# Patient Record
Sex: Male | Born: 1935 | Race: White | Hispanic: No | Marital: Married | Smoking: Never smoker
Health system: Southern US, Community
[De-identification: ages and names within clinical notes are randomized; demographics above are authoritative.]

## PROBLEM LIST (undated history)

## (undated) DIAGNOSIS — K449 Diaphragmatic hernia without obstruction or gangrene: Secondary | ICD-10-CM

## (undated) DIAGNOSIS — I509 Heart failure, unspecified: Secondary | ICD-10-CM

## (undated) DIAGNOSIS — F32A Depression, unspecified: Secondary | ICD-10-CM

## (undated) DIAGNOSIS — M1990 Unspecified osteoarthritis, unspecified site: Secondary | ICD-10-CM

## (undated) DIAGNOSIS — R0600 Dyspnea, unspecified: Secondary | ICD-10-CM

## (undated) DIAGNOSIS — I10 Essential (primary) hypertension: Secondary | ICD-10-CM

## (undated) DIAGNOSIS — K621 Rectal polyp: Secondary | ICD-10-CM

## (undated) DIAGNOSIS — F419 Anxiety disorder, unspecified: Secondary | ICD-10-CM

## (undated) DIAGNOSIS — K219 Gastro-esophageal reflux disease without esophagitis: Secondary | ICD-10-CM

## (undated) DIAGNOSIS — F329 Major depressive disorder, single episode, unspecified: Secondary | ICD-10-CM

## (undated) DIAGNOSIS — K5730 Diverticulosis of large intestine without perforation or abscess without bleeding: Secondary | ICD-10-CM

## (undated) DIAGNOSIS — C61 Malignant neoplasm of prostate: Secondary | ICD-10-CM

## (undated) DIAGNOSIS — D649 Anemia, unspecified: Secondary | ICD-10-CM

## (undated) DIAGNOSIS — R011 Cardiac murmur, unspecified: Secondary | ICD-10-CM

## (undated) DIAGNOSIS — I2510 Atherosclerotic heart disease of native coronary artery without angina pectoris: Secondary | ICD-10-CM

## (undated) DIAGNOSIS — E119 Type 2 diabetes mellitus without complications: Secondary | ICD-10-CM

## (undated) DIAGNOSIS — K2270 Barrett's esophagus without dysplasia: Secondary | ICD-10-CM

## (undated) DIAGNOSIS — N200 Calculus of kidney: Secondary | ICD-10-CM

## (undated) DIAGNOSIS — I359 Nonrheumatic aortic valve disorder, unspecified: Secondary | ICD-10-CM

## (undated) DIAGNOSIS — J189 Pneumonia, unspecified organism: Secondary | ICD-10-CM

## (undated) DIAGNOSIS — R42 Dizziness and giddiness: Secondary | ICD-10-CM

## (undated) DIAGNOSIS — I219 Acute myocardial infarction, unspecified: Secondary | ICD-10-CM

## (undated) HISTORY — DX: Cardiac murmur, unspecified: R01.1

## (undated) HISTORY — DX: Nonrheumatic aortic valve disorder, unspecified: I35.9

## (undated) HISTORY — DX: Rectal polyp: K62.1

## (undated) HISTORY — DX: Anxiety disorder, unspecified: F41.9

## (undated) HISTORY — DX: Type 2 diabetes mellitus without complications: E11.9

## (undated) HISTORY — DX: Anemia, unspecified: D64.9

## (undated) HISTORY — DX: Malignant neoplasm of prostate: C61

## (undated) HISTORY — DX: Diaphragmatic hernia without obstruction or gangrene: K44.9

## (undated) HISTORY — DX: Essential (primary) hypertension: I10

## (undated) HISTORY — PX: CATARACT EXTRACTION: SUR2

## (undated) HISTORY — DX: Barrett's esophagus without dysplasia: K22.70

## (undated) HISTORY — PX: INGUINAL HERNIA REPAIR: SUR1180

## (undated) HISTORY — DX: Major depressive disorder, single episode, unspecified: F32.9

## (undated) HISTORY — DX: Depression, unspecified: F32.A

## (undated) HISTORY — DX: Dizziness and giddiness: R42

## (undated) HISTORY — DX: Calculus of kidney: N20.0

## (undated) HISTORY — PX: OTHER SURGICAL HISTORY: SHX169

## (undated) HISTORY — DX: Diverticulosis of large intestine without perforation or abscess without bleeding: K57.30

## (undated) HISTORY — PX: TRANSURETHRAL RESECTION OF PROSTATE: SHX73

---

## 2000-07-24 ENCOUNTER — Ambulatory Visit (HOSPITAL_COMMUNITY): Admission: RE | Admit: 2000-07-24 | Discharge: 2000-07-24 | Payer: Self-pay | Admitting: Ophthalmology

## 2004-04-04 ENCOUNTER — Encounter (INDEPENDENT_AMBULATORY_CARE_PROVIDER_SITE_OTHER): Payer: Self-pay | Admitting: *Deleted

## 2004-04-04 ENCOUNTER — Observation Stay (HOSPITAL_COMMUNITY): Admission: RE | Admit: 2004-04-04 | Discharge: 2004-04-05 | Payer: Self-pay | Admitting: Urology

## 2004-06-13 ENCOUNTER — Ambulatory Visit (HOSPITAL_COMMUNITY): Admission: RE | Admit: 2004-06-13 | Discharge: 2004-06-13 | Payer: Self-pay | Admitting: Urology

## 2004-08-11 ENCOUNTER — Ambulatory Visit: Payer: Self-pay | Admitting: Family Medicine

## 2004-11-16 ENCOUNTER — Ambulatory Visit: Payer: Self-pay | Admitting: Family Medicine

## 2004-12-07 ENCOUNTER — Ambulatory Visit: Payer: Self-pay | Admitting: Family Medicine

## 2005-01-11 ENCOUNTER — Ambulatory Visit: Payer: Self-pay | Admitting: Family Medicine

## 2005-02-27 ENCOUNTER — Ambulatory Visit: Payer: Self-pay | Admitting: Family Medicine

## 2005-06-12 ENCOUNTER — Ambulatory Visit: Payer: Self-pay | Admitting: Family Medicine

## 2005-08-02 ENCOUNTER — Ambulatory Visit: Payer: Self-pay | Admitting: Family Medicine

## 2005-10-05 ENCOUNTER — Ambulatory Visit: Payer: Self-pay | Admitting: Family Medicine

## 2006-01-11 ENCOUNTER — Ambulatory Visit: Payer: Self-pay | Admitting: Family Medicine

## 2006-02-20 ENCOUNTER — Ambulatory Visit: Payer: Self-pay | Admitting: Family Medicine

## 2006-02-21 ENCOUNTER — Ambulatory Visit: Payer: Self-pay | Admitting: Family Medicine

## 2006-03-05 ENCOUNTER — Ambulatory Visit (HOSPITAL_COMMUNITY): Admission: RE | Admit: 2006-03-05 | Discharge: 2006-03-05 | Payer: Self-pay | Admitting: Urology

## 2006-04-18 ENCOUNTER — Ambulatory Visit: Payer: Self-pay | Admitting: Family Medicine

## 2006-07-27 ENCOUNTER — Ambulatory Visit: Payer: Self-pay | Admitting: Family Medicine

## 2006-08-23 ENCOUNTER — Ambulatory Visit: Payer: Self-pay | Admitting: Family Medicine

## 2006-08-24 ENCOUNTER — Ambulatory Visit: Payer: Self-pay | Admitting: Cardiology

## 2006-08-29 ENCOUNTER — Ambulatory Visit: Payer: Self-pay | Admitting: Family Medicine

## 2006-09-07 ENCOUNTER — Encounter: Payer: Self-pay | Admitting: Cardiology

## 2006-09-13 ENCOUNTER — Ambulatory Visit: Payer: Self-pay | Admitting: Cardiology

## 2007-04-01 ENCOUNTER — Encounter: Admission: RE | Admit: 2007-04-01 | Discharge: 2007-04-01 | Payer: Self-pay | Admitting: Otolaryngology

## 2008-01-20 ENCOUNTER — Ambulatory Visit: Payer: Self-pay | Admitting: Cardiology

## 2008-01-20 ENCOUNTER — Encounter: Payer: Self-pay | Admitting: Physician Assistant

## 2008-04-01 ENCOUNTER — Encounter: Payer: Self-pay | Admitting: Cardiology

## 2009-10-27 DIAGNOSIS — I359 Nonrheumatic aortic valve disorder, unspecified: Secondary | ICD-10-CM | POA: Insufficient documentation

## 2009-10-27 DIAGNOSIS — R0989 Other specified symptoms and signs involving the circulatory and respiratory systems: Secondary | ICD-10-CM

## 2009-10-27 DIAGNOSIS — R0609 Other forms of dyspnea: Secondary | ICD-10-CM

## 2009-10-27 DIAGNOSIS — I119 Hypertensive heart disease without heart failure: Secondary | ICD-10-CM | POA: Insufficient documentation

## 2009-10-28 ENCOUNTER — Ambulatory Visit: Payer: Self-pay | Admitting: Cardiology

## 2009-11-04 ENCOUNTER — Ambulatory Visit: Payer: Self-pay | Admitting: Cardiology

## 2009-11-22 ENCOUNTER — Emergency Department (HOSPITAL_COMMUNITY): Admission: EM | Admit: 2009-11-22 | Discharge: 2009-11-22 | Payer: Self-pay | Admitting: Emergency Medicine

## 2009-11-29 ENCOUNTER — Ambulatory Visit: Payer: Self-pay | Admitting: Cardiology

## 2009-11-29 DIAGNOSIS — Z87448 Personal history of other diseases of urinary system: Secondary | ICD-10-CM

## 2009-12-14 ENCOUNTER — Telehealth (INDEPENDENT_AMBULATORY_CARE_PROVIDER_SITE_OTHER): Payer: Self-pay | Admitting: *Deleted

## 2010-01-06 ENCOUNTER — Encounter: Payer: Self-pay | Admitting: Cardiology

## 2010-01-19 ENCOUNTER — Encounter: Payer: Self-pay | Admitting: Cardiology

## 2010-02-07 ENCOUNTER — Encounter: Payer: Self-pay | Admitting: Cardiology

## 2010-02-24 ENCOUNTER — Encounter (INDEPENDENT_AMBULATORY_CARE_PROVIDER_SITE_OTHER): Payer: Self-pay | Admitting: *Deleted

## 2010-09-25 DIAGNOSIS — K449 Diaphragmatic hernia without obstruction or gangrene: Secondary | ICD-10-CM

## 2010-09-25 DIAGNOSIS — K2270 Barrett's esophagus without dysplasia: Secondary | ICD-10-CM

## 2010-09-25 HISTORY — DX: Diaphragmatic hernia without obstruction or gangrene: K44.9

## 2010-09-25 HISTORY — DX: Barrett's esophagus without dysplasia: K22.70

## 2010-10-27 NOTE — Miscellaneous (Signed)
Summary: meds update  Clinical Lists Changes  Medications: Changed medication from CLONIDINE HCL 0.1 MG TABS (CLONIDINE HCL) Take 1 tablet by mouth every evening to CLONIDINE HCL 0.1 MG TABS (CLONIDINE HCL) Take 1 tablet by mouth as needed if bp > 170

## 2010-10-27 NOTE — Assessment & Plan Note (Signed)
Summary: EST - LAST SEEN 2009   Visit Type:  Follow-up Primary Provider:  Nyland  CC:  follow-up visit.  History of Present Illness:   The patient is a 77 year old male with history of hypertension, diabetes mellitus and aortic sclerosis.  The patient reports no subdural chest pain.  He has no orthopnea PND palpitation or syncope is doing remarkably well.  His blood pressure however significant elevated in the office.  He states however that at home sometimes his blood pressure can be low with systolic lowered on 110 mm of mercury.  This is associated with dizziness and weakness.  The patient denies however any presyncope or syncope.   Preventive Screening-Counseling & Management  Alcohol-Tobacco     Smoking Status: never  Current Medications (verified): 1)  Diltiazem Hcl 30 Mg Tabs (Diltiazem Hcl) .... Take 1 Tablet By Mouth Two Times A Day 2)  Aspir-Low 81 Mg Tbec (Aspirin) .... Take 1 Tablet By Mouth Once A Day 3)  Hyzaar 50-12.5 Mg Tabs (Losartan Potassium-Hctz) .... Take 1 Tablet By Mouth Once A Day 4)  Glipizide 5 Mg Tabs (Glipizide) .... Take 1 Tablet By Mouth Once A Day 5)  Arthritis Pain Regimen Ex St 500 Mg Tbec (Aspirin) .... Take 1 Tablet By Mouth Two Times A Day 6)  Fish Oil 1200 Mg Caps (Omega-3 Fatty Acids) .... Take 1 Tablet By Mouth Four Times A Day 7)  Diovan Hct 160-25 Mg Tabs (Valsartan-Hydrochlorothiazide) .... Take 1/2 Tablet By Mouth Every Morning 8)  Prilosec Otc 20 Mg Tbec (Omeprazole Magnesium) .... Take 1 Tablet By Mouth Once A Day  Allergies (verified): No Known Drug Allergies  Comments:  Nurse/Medical Assistant: The patient's medications and allergies were reviewed with the patient and were updated in the Medication and Allergy Lists. List reviewed.  Past History:  Past Medical History: Last updated: 10/27/2009  Hypertension.      Significantly improved with recent adjustment of medication       regimen.  Type 2 diabetes  mellitus. Dizziness Presyncope Cardiac murmur Aortic Valve disorder Respiratory Abnormal  Past Surgical History: Last updated: 10/27/2009 Cataract Extraction Repair of urethral sphincter as well as replacement  of reservoir and pump.  right inguinal hernia.  Family History: Last updated: 10/27/2009 Notable for CAD, CVA and cancer  Social History: Last updated: 10/27/2009 Retired  Alcohol Use - no Drug Use - no  Risk Factors: Smoking Status: never (10/28/2009)  Social History: Smoking Status:  never  Review of Systems  The patient denies fatigue, malaise, fever, weight gain/loss, vision loss, decreased hearing, hoarseness, chest pain, palpitations, shortness of breath, prolonged cough, wheezing, sleep apnea, coughing up blood, abdominal pain, blood in stool, nausea, vomiting, diarrhea, heartburn, incontinence, blood in urine, muscle weakness, joint pain, leg swelling, rash, skin lesions, headache, fainting, dizziness, depression, anxiety, enlarged lymph nodes, easy bruising or bleeding, and environmental allergies.    Vital Signs:  Patient profile:   77 year old male Height:      67 inches Weight:      173 pounds Pulse rate:   82 / minute BP sitting:   183 / 79  (left arm) Cuff size:   regular  Vitals Entered By: Carlye Grippe (October 28, 2009 9:27 AM)  Serial Vital Signs/Assessments:  Time      Position  BP       Pulse  Resp  Temp     By 10:18 AM            177/87  81                    Hoover Brunette, LPN  CC: follow-up visit   Physical Exam  Additional Exam:  General: Well-developed, well-nourished in no distress head: Normocephalic and atraumatic eyes PERRLA/EOMI intact, conjunctiva and lids normal nose: No deformity or lesions mouth normal dentition, normal posterior pharynx neck: Supple, no JVD.  No masses, thyromegaly or abnormal cervical nodes.  No carotid bruits lungs: Normal breath sounds bilaterally without wheezing.  Normal percussion heart:  regular rate and rhythm with normal S1 and S2, no S3 or S4.  PMI is normal.  No pathological murmurs.  There is however a 2/6 murmur at the right upper sternal border without radiation to the carotids abdomen: Normal bowel sounds, abdomen is soft and nontender without masses, organomegaly or hernias noted.  No hepatosplenomegaly musculoskeletal: Back normal, normal gait muscle strength and tone normal pulsus: Pulse is normal in all 4 extremities Extremities: No peripheral pitting edema neurologic: Alert and oriented x 3 skin: Intact without lesions or rashes cervical nodes: No significant adenopathy psychologic: Normal affect    EKG  Procedure date:  10/28/2009  Findings:      normal sinus rhythm.  Left axis deviation.  Heart rate 76 beats/min  Impression & Recommendations:  Problem # 1:  BEN HTN HEART DISEASE WITHOUT HEART FAIL (ICD-402.10) I asked the patient to change his Diovan/HCTZ to a.m. dosing.it appears the patient may have afternoon hypotension due to multiple drug effect at the same time.  If he remains hypertensive for increase of Diovan/HCTZ may be indicated, possibly in divided doses His updated medication list for this problem includes:    Diltiazem Hcl 30 Mg Tabs (Diltiazem hcl) .Marland Kitchen... Take 1 tablet by mouth two times a day    Aspir-low 81 Mg Tbec (Aspirin) .Marland Kitchen... Take 1 tablet by mouth once a day    Hyzaar 50-12.5 Mg Tabs (Losartan potassium-hctz) .Marland Kitchen... Take 1 tablet by mouth once a day    Arthritis Pain Regimen Ex St 500 Mg Tbec (Aspirin) .Marland Kitchen... Take 1 tablet by mouth two times a day    Diovan Hct 160-25 Mg Tabs (Valsartan-hydrochlorothiazide) .Marland Kitchen... Take 1/2 tablet by mouth every morning  Problem # 2:  OTHER DYSPNEA AND RESPIRATORY ABNORMALITIES (ICD-786.09) patient reports no recurrent dyspnea. His updated medication list for this problem includes:    Diltiazem Hcl 30 Mg Tabs (Diltiazem hcl) .Marland Kitchen... Take 1 tablet by mouth two times a day    Aspir-low 81 Mg Tbec  (Aspirin) .Marland Kitchen... Take 1 tablet by mouth once a day    Hyzaar 50-12.5 Mg Tabs (Losartan potassium-hctz) .Marland Kitchen... Take 1 tablet by mouth once a day    Arthritis Pain Regimen Ex St 500 Mg Tbec (Aspirin) .Marland Kitchen... Take 1 tablet by mouth two times a day    Diovan Hct 160-25 Mg Tabs (Valsartan-hydrochlorothiazide) .Marland Kitchen... Take 1/2 tablet by mouth every morning  Problem # 3:  AORTIC VALVE DISORDERS (ICD-424.1) patient has a soft murmur in exam.  He has previously been evaluated with an echocardiogram.  The murmur does not sound pathologic and I do not think a repeat echocardiogram is currently needed.  Of note is that the patient does not take Hyzaar anymore His updated medication list for this problem includes:    Hyzaar 50-12.5 Mg Tabs (Losartan potassium-hctz) .Marland Kitchen... Take 1 tablet by mouth once a day    Diovan Hct 160-25 Mg Tabs (Valsartan-hydrochlorothiazide) .Marland Kitchen... Take 1/2 tablet by mouth every morning  Other  Orders: EKG w/ Interpretation (93000)  Patient Instructions: 1)  Change Diovan/HCTZ to a.m. dosing 2)  Follow up in  1 month 3)  Nurse visit 1 week to recheck blood pressure

## 2010-10-27 NOTE — Assessment & Plan Note (Signed)
Summary: BP CHECK  Nurse Visit   Vital Signs:  Patient profile:   77 year old male Height:      67 inches Weight:      172.75 pounds Pulse rate:   90 / minute BP sitting:   199 / 86  (left arm) Cuff size:   regular  Vitals Entered By: Cyril Loosen, RN, BSN (November 04, 2009 09:30 AM) Comments Pt here for BP check. He took meds about 0715 this am. Pt brought his home machine for comparison.   Serial Vital Signs/Assessments:  Time      Position  BP       Pulse  Resp  Temp     By 0930                208/101  93                    Cyril Loosen, RN, BSN  Comments: 0930 Pt's BP using his machine (L) arm. By: Cyril Loosen, RN, BSN     Allergies: No Known Drug Allergies Prescriptions: CLONIDINE HCL 0.1 MG TABS (CLONIDINE HCL) Take 1 tablet by mouth once a day  #30 x 6   Entered by:   Hoover Brunette, LPN   Authorized by:   Lewayne Bunting, MD, [facility]   Signed by:   Hoover Brunette, LPN on 11/91/4782   Method used:   Faxed to ...       [facility]ospital [facility] (retail)       [address]       [place], [place]  [address]       Ph: [phone] or [phone]       Fax: [phone]   RxID:   [id number]   Suggest to start Clonidine 0.1mg  Po qdaily Lewayne Bunting, MD, [facility]  November 05, 2009 10:34 AM   Wife notified.  Will send rx to [facility].  Advised to keep bp log and bring to visit in March.  Hoover Brunette, LPN  November 05, 2009 3:06 PM

## 2010-10-27 NOTE — Letter (Signed)
Summary: Engineer, materials at [facility]  [address]   [place], [place] [address]   Phone: [phone]  Fax: [phone]        February 24, 2010 MRN: [id number]   Vincent Savage [address] [place], [place]  [address]   Dear [name],  Your test ordered by Selena Batten has been reviewed by your physician (or physician assistant) and was found to be normal or stable. Your physician (or physician assistant) felt no changes were needed at this time.  ____ Echocardiogram  ____ Cardiac Stress Test  ____ Lab Work  ____ Peripheral vascular study of arms, legs or neck  ____ CT scan or X-ray  ____ Lung or Breathing test  __X__ Other:  blood pressure readings adequate for now per Dr. Andee Lineman    Thank you.   Hoover Brunette, LPN    Duane Boston, M.D., F.A.C.C. Thressa Sheller, M.D., F.A.C.C. Oneal Grout, M.D., F.A.C.C. Cheree Ditto, M.D., F.A.C.C. Daiva Nakayama, M.D., F.A.C.C. Kenney Houseman, M.D., F.A.C.C. Jeanne Ivan, PA-C

## 2010-10-27 NOTE — Letter (Signed)
Summary: Pt's home BP readings  Pt's home BP readings   Imported By: Cyril Loosen, RN, BSN 11/29/2009 14:19:36  _____________________________________________________________________  External Attachment:    Type:   Image     Comment:   External Document

## 2010-10-27 NOTE — Progress Notes (Signed)
Summary: Office Visit/ BLOOD PRESSURE READINGS  Office Visit/ BLOOD PRESSURE READINGS   Imported By: Dorise Hiss 02/15/2010 10:43:23  _____________________________________________________________________  External Attachment:    Type:   Image     Comment:   External Document  Appended Document: Office Visit/ BLOOD PRESSURE READINGS BP readings adquate for now.  Appended Document: Office Visit/ BLOOD PRESSURE READINGS Patient notified by letter.

## 2010-10-27 NOTE — Assessment & Plan Note (Signed)
Summary: 1 MO FU -SRS   Visit Type:  Follow-up Primary Provider:  L. Nyland  CC:  follow-up visit.  History of Present Illness: the patient is a 77 year old male with a history of hypertension, diabetes mellitus and aortic sclerosis.  The patient has had difficulty with control blood pressure there was some confusion regarding the use of diltiazem.  Patient has not been taking his diltiazem.  He reports recent UTI and bronchitis.  He has had some hematuria and he will see Dr. Patsi Sears.  The patient brought in a record of his blood pressure readings.  His blood pressure is under much better control even without diltiazem.  He appears to be responding to clonidine.  The patient denies any chest pain shortness of breath orthopnea or PND.  Preventive Screening-Counseling & Management  Alcohol-Tobacco     Smoking Status: never   Current Medications (verified): 1)  Aspir-Low 81 Mg Tbec (Aspirin) .... Take 1 Tablet By Mouth Once A Day 2)  Glipizide 5 Mg Tabs (Glipizide) .... Take 1 Tablet By Mouth Once A Day 3)  Arthritis Pain Regimen Ex St 500 Mg Tbec (Aspirin) .... Take 1 Tablet By Mouth Two Times A Day 4)  Fish Oil 1200 Mg Caps (Omega-3 Fatty Acids) .... Take 1 Tablet By Mouth Four Times A Day 5)  Diovan Hct 160-25 Mg Tabs (Valsartan-Hydrochlorothiazide) .... Take 1/2 Tablet By Mouth Every Morning 6)  Prilosec Otc 20 Mg Tbec (Omeprazole Magnesium) .... Take 1 Tablet By Mouth Once A Day As Needed 7)  Clonidine Hcl 0.1 Mg Tabs (Clonidine Hcl) .... Take 1 Tablet By Mouth Every Evening  Allergies (verified): 1)  Cipro  Comments:  Nurse/Medical Assistant: The patient's medications were reviewed with the patient and were updated in the Medication List. Pt brought a list of medications to office visit. Pt has not been taking diltiazem because he thought he was suppose to stop this when he started the Clonidine.  Cyril Loosen, RN, BSN (November 29, 2009 2:14 PM)  Past History:  Past  Medical History: Last updated: 10/27/2009  Hypertension.      Significantly improved with recent adjustment of medication       regimen.  Type 2 diabetes mellitus. Dizziness Presyncope Cardiac murmur Aortic Valve disorder Respiratory Abnormal  Past Surgical History: Last updated: 10/27/2009 Cataract Extraction Repair of urethral sphincter as well as replacement  of reservoir and pump.  right inguinal hernia.  Family History: Last updated: 10/27/2009 Notable for CAD, CVA and cancer  Social History: Last updated: 10/27/2009 Retired  Alcohol Use - no Drug Use - no  Risk Factors: Smoking Status: never (11/29/2009)  Review of Systems       The patient complains of blood in urine.  The patient denies fatigue, malaise, fever, weight gain/loss, vision loss, decreased hearing, hoarseness, chest pain, palpitations, shortness of breath, prolonged cough, wheezing, sleep apnea, coughing up blood, abdominal pain, blood in stool, nausea, vomiting, diarrhea, heartburn, incontinence, muscle weakness, joint pain, leg swelling, rash, skin lesions, headache, fainting, dizziness, depression, anxiety, enlarged lymph nodes, easy bruising or bleeding, and environmental allergies.    Vital Signs:  Patient profile:   77 year old male Height:      67 inches Weight:      169.25 pounds Pulse rate:   79 / minute BP sitting:   125 / 72  (left arm) Cuff size:   regular  Vitals Entered By: Cyril Loosen, RN, BSN (November 29, 2009 2:13 PM) CC: follow-up visit  Physical Exam  Additional Exam:  General: Well-developed, well-nourished in no distress head: Normocephalic and atraumatic eyes PERRLA/EOMI intact, conjunctiva and lids normal nose: No deformity or lesions mouth normal dentition, normal posterior pharynx neck: Supple, no JVD.  No masses, thyromegaly or abnormal cervical nodes.  No carotid bruits lungs: Normal breath sounds bilaterally without wheezing.  Normal percussion heart: regular  rate and rhythm with normal S1 and S2, no S3 or S4.  PMI is normal.  No pathological murmurs.  There is however a 2/6 murmur at the right upper sternal border without radiation to the carotids abdomen: Normal bowel sounds, abdomen is soft and nontender without masses, organomegaly or hernias noted.  No hepatosplenomegaly musculoskeletal: Back normal, normal gait muscle strength and tone normal pulsus: Pulse is normal in all 4 extremities Extremities: No peripheral pitting edema neurologic: Alert and oriented x 3 skin: Intact without lesions or rashes cervical nodes: No significant adenopathy psychologic: Normal affect    Impression & Recommendations:  Problem # 1:  BEN HTN HEART DISEASE WITHOUT HEART FAIL (ICD-402.10) blood pressure is under much better control.  We discontinue diltiazem.  The patient will keep track of his blood pressure readings.I also asked him to change his clonidine to p.m. dosing secondary to some associated fatigue with the use of clonidine. The following medications were removed from the medication list:    Diltiazem Hcl 30 Mg Tabs (Diltiazem hcl) .Marland Kitchen... Take 1 tablet by mouth two times a day His updated medication list for this problem includes:    Aspir-low 81 Mg Tbec (Aspirin) .Marland Kitchen... Take 1 tablet by mouth once a day    Arthritis Pain Regimen Ex St 500 Mg Tbec (Aspirin) .Marland Kitchen... Take 1 tablet by mouth two times a day    Diovan Hct 160-25 Mg Tabs (Valsartan-hydrochlorothiazide) .Marland Kitchen... Take 1/2 tablet by mouth every morning    Clonidine Hcl 0.1 Mg Tabs (Clonidine hcl) .Marland Kitchen... Take 1 tablet by mouth every evening  Problem # 2:  AORTIC VALVE DISORDERS (ICD-424.1) the patient has no significant aortic stenosis.  Further follow-up with clinical exam in 6 months. His updated medication list for this problem includes:    Diovan Hct 160-25 Mg Tabs (Valsartan-hydrochlorothiazide) .Marland Kitchen... Take 1/2 tablet by mouth every morning  Problem # 3:  HEMATURIA, HX OF (ICD-V13.09) the  patient will follow up with Dr. Patsi Sears.  Patient Instructions: 1)  Stop Diltiazem 2)  Take Clonidine in the evening.   3)  Follow up in  6 months.

## 2010-10-27 NOTE — Progress Notes (Signed)
Summary: PHONE: FATIGUE/MEDICATION ISSUES  Phone Note Call from Patient Call back at Home Phone [phone]   Caller: Patient Reason for Call: Talk to Nurse, Talk to Doctor Summary of Call: Mr. Vonbargen called stating that he is very fatigue and he feels his BP is dropping. States that he thinks some of his medications is making him feel this way.  Initial call taken by: Zachary George,  December 14, 2009 2:37 PM  Follow-up for Phone Call        States x 2-3 days, been feeling very tired.  Stopped Diltiazem and started Clonidine 0.1mg  daily.  118/64 on 3/21,   103/58 on 3/22, and today 101/57.   Hoover Brunette, LPN  December 14, 2009 4:31 PM  may want to hold clonidine for now and recheck blood pressure Follow-up by: Lewayne Bunting, MD, [facility],  December 14, 2009 5:17 PM  Additional Follow-up for Phone Call Additional follow up Details #1::        Advised pt. on above.  Will call back in 7-10 with update.   Hoover Brunette, LPN  December 16, 2009 3:45 PM

## 2010-10-27 NOTE — Progress Notes (Signed)
Summary: Office Visit/ BLOOD PRESSURE READINGS  Office Visit/ BLOOD PRESSURE READINGS   Imported By: Dorise Hiss 01/11/2010 11:10:54  _____________________________________________________________________  External Attachment:    Type:   Image     Comment:   External Document  Appended Document: Office Visit/ BLOOD PRESSURE READINGS suggest for patient not to take routinely clonidine, and to check blood pressure twice a day and if blood pressure greater than 170 mmHg and a clonidine 0.1 mg p.r.n.  Appended Document: Office Visit/ BLOOD PRESSURE READINGS Patient notified.  Patient verbalized understanding.

## 2010-12-14 LAB — URINE CULTURE: Colony Count: 75000

## 2010-12-14 LAB — CBC
HCT: 34.1 % — ABNORMAL LOW (ref 39.0–52.0)
Hemoglobin: 11.9 g/dL — ABNORMAL LOW (ref 13.0–17.0)
Platelets: 168 10*3/uL (ref 150–400)
RDW: 13.1 % (ref 11.5–15.5)

## 2010-12-14 LAB — POCT I-STAT, CHEM 8
Calcium, Ion: 1.11 mmol/L — ABNORMAL LOW (ref 1.12–1.32)
HCT: 34 % — ABNORMAL LOW (ref 39.0–52.0)
Hemoglobin: 11.6 g/dL — ABNORMAL LOW (ref 13.0–17.0)
Potassium: 4.1 mEq/L (ref 3.5–5.1)
TCO2: 23 mmol/L (ref 0–100)

## 2010-12-14 LAB — DIFFERENTIAL
Basophils Relative: 1 % (ref 0–1)
Eosinophils Absolute: 0.1 10*3/uL (ref 0.0–0.7)
Eosinophils Relative: 1 % (ref 0–5)
Lymphocytes Relative: 5 % — ABNORMAL LOW (ref 12–46)
Monocytes Absolute: 0.9 10*3/uL (ref 0.1–1.0)
Monocytes Relative: 8 % (ref 3–12)
Neutro Abs: 10.3 10*3/uL — ABNORMAL HIGH (ref 1.7–7.7)

## 2010-12-14 LAB — POCT URINALYSIS DIP (DEVICE)
Bilirubin Urine: NEGATIVE
Glucose, UA: NEGATIVE mg/dL
Ketones, ur: NEGATIVE mg/dL

## 2011-02-07 NOTE — Assessment & Plan Note (Signed)
[facility]                          [facility] OFFICE NOTE   NAME:Vincent Savage, Vincent Savage                     MRN:          [id number]  DATE:01/20/2008                            DOB:          January 22, 1918    CARDIOLOGIST:  Learta Codding, M.D., [facility]   PRIMARY CARE PHYSICIAN:  Delaney Meigs, M.D.   REASON FOR VISIT:  30-month followup.   HISTORY OF PRESENT ILLNESS:  Vincent Savage is a delightful 77 year old  male patient with a history of hypertension and diabetes mellitus who  presents to the office today for followup.  He was initially seen by Dr.  Andee Lineman in November of 2007.  At that time, he presented with near-  syncope in the setting of accelerated hypertension.  The patient's  medications were adjusted for blood pressure.  He presented back in  followup in December of 2007 and was doing well.  It was noted that he  had had an echocardiogram that revealed LVH with an EF of 55-60%, aortic  sclerosis with mild trace AI, no significant pulmonary hypertension, and  no definite aortic stenosis.   In the office today, the patient notes he is doing well.  He denies any  chest pain.  Denies any syncope.  He does have occasional episodes of  dizziness that seem to be related to head positioning.  He denies any  true vertiginous symptoms.  He denies any near-syncope or syncope.  He  denies any symptoms similar to what he had when he initially presented  to [place].  He denies orthopnea, PND or pedal edema.  He denies any  palpitations.  He is quite active around his house.  He can ambulate up  and down steps without any significant shortness of breath or chest  pain.   CURRENT MEDICATIONS:  1. Aspirin 81 mg daily.  2. Hyzaar 50/12.5 mg daily.  3. Glipizide 5 mg daily.  4. Arthritis pain reliever b.i.d.  5. Diltiazem 30 mg b.i.d.  6. Fish oil 1200 mg 4 times a day.   PHYSICAL EXAMINATION:  GENERAL:  He is a well-nourished, well-nourished  male.  VITAL SIGNS:  Blood pressure is 163/85, pulse 73, weight 170.4 pounds.  Repeat blood pressure by me by manual cuff is 136/90 on the right,  140/90 on the left.  HEENT:  Normal.  NECK:  Without JVD, lymphadenopathy.  CARDIAC:  Normal S1-S2.  Regular rate and rhythm.  2/6 systolic ejection  murmur best heard at the right upper sternal border.  LUNGS:  Clear to auscultation bilaterally.  ABDOMEN:  Soft, nontender, with normoactive bowel sounds, no  organomegaly.  EXTREMITIES:  Without edema.  NEUROLOGIC:  He is alert and oriented x3.  Cranial nerves II-XII grossly  intact.  VASCULAR:  No carotid bruits noted bilaterally.  No femoral artery  bruits noted bilaterally.   Electrocardiogram reveals sinus rhythm at a rate of 71, leftward axis,  question incomplete right bundle branch block.  No significant change  since previous tracings.   IMPRESSION:  1. Hypertension.  2. Diabetes mellitus.  3. Good left ventricular function.  4. Aortic  sclerosis without aortic stenosis.  5. Dizziness.   PLAN:  Vincent Savage returns to the office day for annual followup.  Overall, he is doing well.  His blood pressure is somewhat borderline.  He has been unable to remember to take his diltiazem 3 times a day.  I  think if he took his diltiazem 3 times a day, he would have optimal  blood pressure.  He will try to adjust how he takes this.  He has annual  followup with Dr. Lysbeth Galas scheduled soon.  No further workup is required  from a cardiovascular standpoint.  I think overall he is doing well, and  we see him back in one year's time in routine followup.      Tereso Newcomer, PA-C  Electronically Signed      Learta Codding, MD,FACC  Electronically Signed   SW/MedQ  DD: 01/20/2008  DT: 01/20/2008  Job #: [id number]   cc:   Delaney Meigs, M.D.

## 2011-02-10 NOTE — Op Note (Signed)
NAME:  Vincent Savage, SLUSHER                        ACCOUNT NO.:  [id number]   MEDICAL RECORD NO.:  [id number]                   PATIENT TYPE:  AMB   LOCATION:  DAY                                  FACILITY:  [facility]   PHYSICIAN:  Sigmund I. Patsi Sears, M.D.         DATE OF BIRTH:  1918-03-31   DATE OF PROCEDURE:  06/13/2004  DATE OF DISCHARGE:                                 OPERATIVE REPORT   PREOPERATIVE DIAGNOSIS:  Malfunction of AMS Urethral Sphincter Prosthesis.   POSTOPERATIVE DIAGNOSIS:  Malfunction of AMS Urethral Sphincter Prosthesis.   PROCEDURES PERFORMED:  Repair of urethral sphincter as well as replacement  of reservoir and pump.   SURGEON:  Dr. Jethro Bolus   RESIDENT SURGEON:  Dr. Thyra Breed   INDICATIONS FOR PROCEDURE:  Vincent Savage is an 77 year old male who had an  artificial urinary sphincter placed in June 1994 following incontinence,  status post radical perineal prostatectomy.  His sphincter was  malfunctioning recently in July 2005 when he underwent replacement of his  sphincter device as well as a right inguinal herniorrhaphy by Dr. Zachery Dakins.  The patient was seen recently in the office to activate his sphincter when  it was found the reservoir to be in the inguinal canal and, in addition, the  pump has migrated near the penile shaft.  Therefore, Vincent Savage is  counseled on having to undergo another surgical procedure for repair of his  AMS sphincter.  Specifically, to find out why the components migrated and  put them in their proper location prior to activating the new sphincter.  All the risks, benefits, and alternatives of the procedure have been  described in detail, and the patient is willing to proceed.   PROCEDURE IN DETAIL:  Following identification by his arm bracelet, the  patient was brought to the operating room and placed in the supine position.  Here, he underwent successful induction of general endotracheal anesthesia  and received  preoperative IV antibiotics.  He was then moved to the dorsal  lithotomy position.  His perineum and genitalia were then shaved and given a  10 minute Betadine prep.  The genitalia, perineum, and lower abdomen were  then scrubbed with Betadine and draped in the usual sterile fashion.  Initial inspection showed the reservoir to be palpable within the left  inguinal region as well as at the base of the penis on the left as well.  The sphincter appeared to be deactivated as it was left from the prior  surgical procedure.  We then placed the ring retractor.  An incision was  made in the midline raphe of the scrotum approximately 4 cm long.  Then the  retractor hooks were placed into the ring to provide optimum exposure.  Using Bovie electrocautery and pick-up, the dartos tissue was dissected down  until we came upon the sphincter pump.  Its capsule was opened, and the pump  was brought out through its capsulotomy.  It did appear to be entangled  within the tubing going to the reservoir.  At this time, with minimal  opening of further capsule, the reservoir full of saline was pulled from the  inguinal region, actually right near the base of the penis.  With these  components in place, we evaluated them for integrity.  There appeared to be  no damage.  However, given the previous placement, the tubing was in fact,  too short to replace the reservoir, and our tubing length was also limited  by the sphincter pump.  However, we elected not to dissect around the  urethra to evaluate the sphincter cuff, as we had no reason to believe it  would be defective.  Therefore, we elected to place a new reservoir and  sphincter pump.  First, we used a long tonsil.   The anterior rectus fascia was then opened in the midline using Bovie  electrocautery, exposing the two bellies of the rectus abdominus muscles.  The muscle was then parted, and a potential space was then created below the  left rectus muscle for  placement of the new sphincter reservoir.  We then  used the long tonsil clamp from the scrotal incision through the  subcutaneous fat pad well to the left lateral aspect and nerve supply to the  penis until the tonsil tips were exposed through the space created below the  left rectus muscle.  A tonsil was then used to follow this through the  scrotal incision.  The tip of the reservoir tubing was then placed within  the tonsil clamp, and this was pulled into the midline incision for its  connection with the reservoir.  At this time, we copiously irrigated the  subrectus pocket with antibiotic solution as well as the reservoir tubing  components.  In a standard fashion, we then applied all connectors to affix  the reservoir to the pump which was located in the scrotal incision.  There  was adequate tubing to make this connection.  Using 0 Vicryl suture, we then  reapproximated the anterior rectus fascia overlying the sphincter reservoir  in a running fashion.  Subcutaneous tissues were then irrigated.  Surgical  clips were used to close the midline incision.   We then turned our attention to the sphincter pump.  A subdartos pouch was  created deep in the dependent portion of the left hemiscrotum.  A Babcock  clamp was then used to hold the pump in this position while the remaining  connection from the pump to the cuff was created in a standard fashion.  The  scrotal incision was then again copiously irrigated with antibiotic  solution.  With all connections in place, we began our closure.  First, to  hold the sphincter pump in its dependent portion of the hemiscrotum, we  placed 2-0 Vicryl interrupted sutures to close the potential space and allow  capsule to form.  We then separated the tubing from the reservoir going to  the sphincter cuff by another layer of dartos closure using running 2-0 Vicryl suture.  There was no tubing exposed at this time.  Therefore, using  2-0 Vicryl, we  closed the dartos layer in a third layer in a running fashion  prior to closure of the skin.  Then 4-0 Vicryl was used to reapproximate the  skin.  Hemostasis was excellent.  All sponge, needle, and instrument counts  were correct x 2.  We then cycled the cuff to ensure its patency.  Once  cycled, we deactivated the cuff.  The scrotal incision was washed, dried,  and collodion applied.  The midline incision was washed, dried, and a  Tegaderm applied.  Kerlix fluff was applied to the scrotum along with a jock  strap.  The patient tolerated the procedure well, and there were no  complications.  Please note that Dr. Jethro Bolus was present and  participated in the entire procedure, as he was the responsible surgeon.   DISPOSITION:  After awaking from general anesthesia, the patient was  transported to the postanesthesia care unit in stable condition.  From here,  he would be observed in the postanesthesia care unit until ready for  discharge to home.  He was sent home with a prescription for 5 days of Cipro  as well as Percocet 10/650 for pain control.  He is to call [phone] for a  return appointment.      EG/MEDQ  D:  06/13/2004  T:  06/14/2004  Job:  [id number]

## 2011-02-10 NOTE — Assessment & Plan Note (Signed)
[facility]                          [facility] OFFICE NOTE   NAME:Vincent Savage, Vincent Savage                     MRN:          [id number]  DATE:09/13/2006                            DOB:          [birth year]    PRIMARY CARDIOLOGIST:  Dr. Lewayne Bunting.   REASON FOR OFFICE VISIT:  Scheduled office followup here.  Please refer  to Dr. Margarita Mail initial consultation note of November 30 for full  details.   Of note, the patient also was referred for an echocardiogram for  assessment of left ventricular function and severity of valvular heart  disease.  Left ventricular function is normal (55-60%) with aortic  sclerosis/mild aortic insufficiency, and no pulmonary hypertension.   The patient reports significant improvement in his symptoms of dizziness  since having his medications adjusted by Dr. Andee Lineman, with up-titration  of Cozaar and the addition of long-acting diltiazem for better blood  pressure control.  He presented with bilateral blood pressure readings  of 180/90 when initially seen in our clinic, and has since had a  significant downward trending of his blood pressure at home with several  readings below 140 systolic.   Given this, the patient also reports that he is generally feeling much  better, and that his episodes of dizziness have decreased significantly.   CURRENT MEDICATIONS:  1. Diltiazem ER 90 daily.  2. Hyzaar 100/25 daily.  3. Aspirin 81 daily.  4. Glipizide ER 2.5 daily.   PHYSICAL EXAM:  Blood pressure 160/80, pulse 60 and regular.  Weight  167.  GENERAL:  An 77 year old male sitting upright in no distress.  NECK:  Palpable carotid pulse without bruits; no JVD.  LUNGS:  Clear to auscultation in all fields.  HEART:  Regular rate and rhythm (S1, S2).  Grade 2/6 short systolic  ejection murmur at the base.  EXTREMITIES:  No pedal edema.  NEURO:  No focal deficit.   IMPRESSION:  1. Hypertension.      a.     Significantly improved  with recent adjustment of medication       regimen.  2. Normal left ventricular function.      a.     By current 2D echocardiogram.  3. Type 2 diabetes mellitus.   PLAN:  In order to reduce cost, the patient will be placed on short-  acting diltiazem at 30 t.i.d. to take advantage of the $4/month program  at [facility] pharmacy.  We will plan on having him return to the clinic in  1 year to followup with Dr. Andee Lineman.      Gene Serpe, PA-C  Electronically Signed      Learta Codding, MD,FACC  Electronically Signed   GS/MedQ  DD: 09/13/2006  DT: 09/13/2006  Job #: [id number]   cc:   Delaney Meigs, M.D.

## 2011-02-10 NOTE — Assessment & Plan Note (Signed)
[facility]                          [facility] OFFICE NOTE   NAME:Vincent Savage                     MRN:          [id number]  DATE:08/24/2006                            DOB:          [birth year]    REASON FOR CONSULTATION:  Evaluation of severe hypertension.   HISTORY OF PRESENT ILLNESS:  Vincent Savage is an 77 year old male with a  history of prostate cancer, but no known cardiovascular disease.  The  patient has a longstanding history of hypertension, and has been on  blood pressure medications with Hyzaar.  Over the last several years his  blood pressure appears to have been relatively controlled, and he  usually checks it on a weekly basis.  More recently the patient was  found to have surges in his blood pressure.  In particular the patient  reported yesterday a dizzy spell when he was trying to set the  thermostat.  He felt that he had an almost near-syncopal episode.  He  checked his blood pressure and it was 222/97.  He went to Dr. Joyce Copa  office, where the blood pressure was confirmed to be particularly  elevated.  It was felt that the patient was in need of a cardiology  consultation for management of his blood pressure, and to rule out  underlying cardiovascular disease.  The patient states that he is  otherwise very active.  He is an NYHA class II.  He does a lot of work  around the house, and last week was cleaning his gutters.  He did not  report any chest pain or shortness of breath.  He also states that he  had some lab work drawn on November 26 by Dr. Lysbeth Galas, and the plan is  apparently to start Zocor.  The remainder of the review of systems is  essentially noncontributory, although the patient does report some  numbness in both lower extremities.   ALLERGIES:  No known drug allergies.   MEDICATIONS:  1. Hyzaar 50/12/5 mg p.o. daily.  2. Glipizide ER 2.5 mg p.o. daily.  3. Arthritis pain reliever.  4. Aspirin 81 mg  p.o. daily.   SOCIAL HISTORY:  The patient lives in [place].  He lives with his wife.  He does not smoke or drink.   FAMILY HISTORY:  Notable for coronary artery disease, CVA and cancer.   REVIEW OF SYSTEMS:  The patient denies any fever or chills.  He does  report some faintness.  He also has significant hearing loss.  He  reports near-syncope, as outlined above, but no sweating or  claudication.  The patient reports a cough which is nonproductive.  He  reports cramps and pains in toes which occurred last evening.  He denies  any rash or pruritus.  He has no weakness or tremors.  Review of systems  is positive for dizziness as outlined above.  He is also very anxious.  The patient denies any melena or hematochezia.  There is no dysuria or  frequency.  There is no flatulence.  The remainder of the review of  systems is negative, and is documented as  above.   PHYSICAL EXAMINATION:  VITAL SIGNS:  Blood pressure 180/90 in the left  arm and 180/90 in the right arm, heart rate is 84 beats per minute.  GENERAL:  Well-nourished white male in no apparent distress.  HEENT:  Pupils anicteric, conjunctivae clear.  NECK:  Supple.  Normal carotid upstroke, no carotid bruits.  Oropharynx  is clear, no thyromegaly.  LUNGS:  Clear bilaterally.  There is no wheezing.  HEART:  Regular rate and rhythm with normal S1, S2.  There is a 2-3/6  crescendo/decrescendo murmur at the right upper sternal border which is  mid to early peaking.  There is no S3, there is no soft second heart  sound either.  The PMI is nondisplaced.  ABDOMEN:  Soft, nontender, no rebound or guarding.  There is no  abdominal bruit.  There is no pulsatile mass.  EXTREMITIES:  Exam reveals no cyanosis, clubbing or edema.  Pulses are  intact bilaterally.  NEUROLOGIC:  The patient is alert, oriented and grossly nonfocal.   EKG:  A 12-lead EKG normal sinus rhythm, detect no acute ischemic  changes and no definite evidence of left  ventricular hypertrophy.   PROBLEM LIST:  1. Accelerated hypertension.  2. Diabetes mellitus.  3. Dizziness and presyncope, possibly related to #1.  4. Cardiac murmur [likely aortic sclerosis].  In addition, the      patient's murmur is located at the right upper sternal border,      suggestive of aortic root diltation.  We will further evaluate this      with an echocardiographic study.   PLAN:  1. I will add diltiazem ER to the patient's medical regimen at 90 mg a      day.  2. I have asked the patient to increase his Hyzaar to 100/25 mg p.o.      daily.  3. The patient will have an echocardiographic study done in 2 weeks to      evaluate the cardiac murmur.  4. I have asked to the patient to have his blood pressure checked on      Tuesday at Dr. Joyce Copa office.  If it is not controlled, he may      need to have diltiazem increased, and possibly have the addition of      clonidine.  The patient also will need in 1 week laboratory work      done, which will need to include a BMET.  5. I will plan to see the patient back in 3 weeks in followup, and      also discuss the echo results at that time.     Learta Codding, MD,FACC  Electronically Signed    GED/MedQ  DD: 08/24/2006  DT: 08/25/2006  Job #: [id number]   cc:   Delaney Meigs, M.D.

## 2011-02-10 NOTE — Op Note (Signed)
NAME:  Vincent Savage, Vincent Savage                        ACCOUNT NO.:  [id number]   MEDICAL RECORD NO.:  [id number]                   PATIENT TYPE:  INP   LOCATION:  0374                                 FACILITY:  [facility]   PHYSICIAN:  Anselm Pancoast. Zachery Dakins, M.D.          DATE OF BIRTH:  1918/03/10   DATE OF PROCEDURE:  04/04/2004  DATE OF DISCHARGE:                                 OPERATIVE REPORT   PREOPERATIVE DIAGNOSES:  1. Large right inguinal hernia.  2. Valve prostate valve rupture.   OPERATION:  Valve remover and replacement (to be dictated by Dr.  Patsi Sears).  My portion is a right inguinal hernia.   FINDINGS:  Large indirect hernia.   HISTORY:  Vincent Savage is an 77 year old male who years ago had a  prostate cancer, and then after a radical prostatectomy had problems with  urinary incontinence; had a valve placed for control approximately nine  years ago.  The patient was also noted to have an inguinal hernia about that  time.  He saw Dr. Maryagnes Amos.  Nothing was advised to do about the hernia.  Recently with the large hernia, the valve is no longer continent and the  patient was referred to me approximately four weeks ago.  In agreement to  repair the hernia simultaneously with Dr. Patsi Sears for removal and  replacing the urethral sphincter prosthesis, Dr. Patsi Sears said he wanted  to be present to remove the nonfunctioning prosthesis on the right at the  time I repaired his hernia.   PREOPERATIVE MEDICATIONS/PREPARATIONS:  Preoperatively he was given 1 g  Kefzol and then prepped sterilely.  After a good Betadine Dr. Patsi Sears  placed a Foley catheter on the operative field.   DESCRIPTION OF PROCEDURE:  Next, the right inguinal incision was made with  sharp dissection down through the skin and subcutaneous Scarpa's fascia.  He  had a very large hernia.  The cord structures were elevated and encompassed  with a Penrose. Then at this time the prosthesis reservoir was up  kind of  medial and superior to the hernia sac.  Dr. Patsi Sears removed the  prosthesis and the tube connectors.   Next, I then separated the hernia sac; this was done on the spinal, but he  also had pretty heavy sedation.  I separated the hernia sac from the cord  structures, and was surprised to see that it was a large indirect hernia.  The sac itself was probably 8 inches and about the size of a stadium cup.  The hernia sac was separated from the surrounding cord structures.  I then  opened the hernia sac.   A high sac ligation was done in direct vision.  The valve was placed back  into the peritoneal cavity (right at the internal ring area) with an 0  Surgilene, and a second suture placed distal to this; then the hernia sac  was removed.   Next, as far as the  floor, where the floor had been basically broken down  because of the large hernia sac, I repaired it with interrupted sutures of 0  Surgilene to recreate the internal ring area.  Next, a piece of Prolene mesh  (like a sail slit laterally) was used to reinforce the floor.  I started at  the symphysis pubis and sutured the inferior limb with a running 2-0  Prolene.  The two tails have been sutured together laterally to reinforce  the internal ring, and then the superior flap was sutured down with  interrupted sutures of 2-0 Prolene to the conjoined tendon and internal  oblique area.  The ilioinguinal nerve, hopefully we protected that.  We then  closed the external oblique.  The cord and testicle on the right, with  removal of the reservoir down at the base of the scrotum, was nearly  mobilized; this was placed back into the scrotum by Dr. Patsi Sears.  Then  the external oblique was closed with a running 3-0 Vicryl, and the testicle  was in its normal pocket.  The Scarpa's fascia was closed with interrupted 3-  0 Vicryl.  The skin was then closed with staples.   Next, Dr. Patsi Sears dissected out the prosthetic sphincter  around the base  of the urethra.  It was at this time that Dr. Brunilda Payor scrubbed in with him and  I was excused.   DISPOSITION:  The patient tolerated the procedure nicely from the hernia  standpoint.  We will keep his Foley catheter for at least 24-48 hr.                                               Anselm Pancoast. Zachery Dakins, M.D.    WJW/MEDQ  D:  04/04/2004  T:  04/05/2004  Job:  [id number]

## 2011-02-10 NOTE — H&P (Signed)
[facility]  Patient:    Vincent Savage, Vincent Savage                     MRN: [id number] Adm. Date:  [id number] Disc. Date: [id number] Attending:  Ivor Messier                         History and Physical  This was a planned outpatient surgical admission of this 77 year old white male admitted for cataract implant surgery of the right eye.  HISTORY OF PRESENT ILLNESS:  This patient has been followed in my office for routine eye care since April 22, 1984.  Examination at that time revealed an acuity of 20/60 right eye, 20/70 left eye without correction and 20/30 right eye, 20/25 left eye with correction.  Presbyopia was noted.  More recently, the patient has gradually developed blurred vision in both eyes.  By August of 1981 he was complaining that his glasses did not work well from the [facility] [place] and that he was told he had cataract formation. Examination revealed a visual acuity with best correction at 20/70 right eye, 20/50 left eye, and nuclear cataract formation was confirmed.  The patient elected to proceed with cataract implant surgery of the right eye now and left eye later in hopes of obtaining a clear, sharper vision.  He signed an informed consent and arrangements were made for his outpatient admission at this time.  PAST MEDICAL HISTORY:  The patient is in stable general health under his regular physician, Dr. Candie Echevaria.  MEDICATIONS: 1. Zantac. 2. Hazaar. 3. One aspirin a day.  He has been told to discontinue the aspirin one week prior to surgery.  REVIEW OF SYSTEMS:  No cardiorespiratory complaints.  PHYSICAL EXAMINATION:  VITAL SIGNS:  As recorded on admission, blood pressure 127/69, temperature 98, pulse 59, and respirations 18.  GENERAL:  The patient is a pleasant, well-developed, well-nourished, 77 year old white male in no acute distress.  HEENT:  Eyes; visual acuity as noted above.  Applanation tonometry 17 mm  each eye.  External ocular and slit-lamp examination; nuclear cataract formation both eyes.  Ocular motility; hypertropia with approximately 12 prism diopters base down right eye.  Fundus examination dilated with Mydriacyl revealed cataract haze, a clear vitreous, attached retina with normal optic nerve, blood vessels, and macula.  CHEST:  Lungs clear to auscultation and percussion.  HEART:  Normal sinus rhythm.  No cardiomegaly and no murmurs.  ABDOMEN:  Negative.  EXTREMITIES:  Negative.  ADMISSION DIAGNOSES: 1. Senile cataract, both eyes. 2. Right hypertropia.  PLAN:  Cataract implant surgery now right eye, left eye later. DD:  07/24/00 TD:  07/24/00 Job: [id number]

## 2011-02-10 NOTE — Op Note (Signed)
[place]  Patient:    Vincent Savage, Vincent Savage                     MRN: [id number] Proc. Date: 07/24/00 Adm. Date:  [id number] Disc. Date: [id number] Attending:  Ivor Messier                           Operative Report  PREOPERATIVE DIAGNOSIS:  Senile nuclear cataract right eye.  POSTOPERATIVE DIAGNOSIS:  Senile nuclear cataract right eye.  PROCEDURE:  Planned extracapsular cataract extraction - phacoemulsification, primary insertion of posterior chamber interocular lens implant.  SURGEON:  Guadelupe Sabin, M.D.  ASSISTANT:  Nurse.  ANESTHESIA:  Local with 4% Xylocaine, 0.75% Marcaine, retrobulbar block, topical Tetracaine, interocular Xylocaine.  Anesthesia standby required.  The patient given sodium Pentothal or Ditropan intravenously during the period of retrobulbar injection.  DESCRIPTION OF PROCEDURE:  After the patient was prepped and draped, a speculum was inserted in the right eye.  The eye was turned downward and a superior rectus traction suture placed.  Schiotz tonometry was recorded at 4 to 5 scale units with a 5.5 gram weight.  A peritomy was performed adjacent to the limbus from the 11 to 1 oclock position.  The corneoscleral junction was cleaned and a corneoscleral groove made with a 45 degree Superblade.  The anterior chamber was then entered with a 2.5 mm diamond Keratome at the 12 oclock position and the 15 degree blade at the 2:30 position.  Using a bent 26 gauge needle and a Helon syringe, a capsulorrhexis was begun and completed with the Graebo forceps.  Hydrodissection and hydrodelineation were performed using 1% Xylocaine.  The 30 degree phacoemulsification tip was then inserted with slow, controlled emulsification of the rather firm lens nucleus.  Total ultrasonic time 1 minute 12 seconds, average power level 11%, total amount of fluid used during the emulsification 55 cc.  Following removal of the nucleus, the  residual cortex was aspirated with the irrigation/aspiration tip.  The posterior capsule appeared intact with a brilliant red fundus reflex.  It was therefore elected to insert an [employer]ergon medical optics [id number] silicon posterior chamber interocular lens implant with UV absorber, Diopter strength +21.00.  This was inserted with the McDonald forceps into the anterior chamber and then centered into the capsular bag using the [facility] lens rotator.  The lens appeared to be well centered.  The Helon which had been used during the procedure was aspirated and replaced with balanced salt solution and Miochol ophthalmic solution.  The operative incision appeared to be self-sealing and no sutures were required.  A light patch and protector shield were applied to the operated right eye along with Maxitrol ointment.  Duration of the procedure and anesthesia administraction 45 minutes.  The patient tolerated the procedure well in general and left the operating room for the recovery room in good condition. DD:  07/24/00 TD:  07/24/00 Job: [id number]

## 2011-02-10 NOTE — Op Note (Signed)
NAME:  Vincent Savage, Vincent Savage                        ACCOUNT NO.:  [id number]   MEDICAL RECORD NO.:  [id number]                   PATIENT TYPE:  INP   LOCATION:  0007                                 FACILITY:  [facility]   PHYSICIAN:  Sigmund I. Patsi Sears, M.D.         DATE OF BIRTH:  Jun 13, 1918   DATE OF PROCEDURE:  04/04/2004  DATE OF DISCHARGE:                                 OPERATIVE REPORT   PREOPERATIVE DIAGNOSES:  Malfunction AMS urethral sphincter prosthesis.   POSTOPERATIVE DIAGNOSES:  Malfunction AMS urethral sphincter prosthesis.   OPERATION:  Replacement of urethral sphincter.   SURGEON:  Sigmund I. Patsi Sears, M.D.   FIRST ASSISTANT:  Lindaann Slough, M.D.   PREPARATION:  After appropriate preanesthesia, the patient is brought to the  operating room, placed on the operating table in dorsal supine position.  He  was replaced in the right lateral decubitus position where spinal anesthetic  was introduced. He was then replaced in the supine position, where the pubis  was prepped with Betadine solution and draped in the usual fashion.   DESCRIPTION OF PROCEDURE:  The patient underwent right inguinal hernia  repair by Dr. Zachery Dakins, and during the repair by Dr. Zachery Dakins, the tubing  for the sphincter reservoir was identified and dissected proximal ward. The  reservoir was identified, with a blow out in the junction at the tubing in  the reservoir. Following this, the reservoir tubing removed.   Following right inguinal hernia repair, the wound is cleansed, and a midline  scrotal incision is made, subcutaneous tissue dissected in the midline. The  button for the previously placed prosthesis identified, and dissected. The  button is removed, and a 4 cm sphincter is placed. A separate left inguinal  incision is made, subcutaneous tissue dissected, and through the sidewall of  the inguinal canal, the reservoir is placed, under the rectus fascia.  Following this, the tubing was  brought into the wound. The sphincter tubing  was brought into the left lower quadrant wound, and the pump was placed in  the scrotum, superficial dartos pouch.  Clamps were placed, and tubing  connections were placed  appropriately. Each wound was then closed separately with interrupted and  running 2 and 3-0 Vicryl suture and skin staples in the left lower quadrant,  and 4-0 Vicryl suture in the skin in the scrotal portion.  The patient  tolerated the procedure well.  He was taken to the recovery room in good  condition.                                               Sigmund I. Patsi Sears, M.D.    SIT/MEDQ  D:  04/04/2004  T:  04/04/2004  Job:  [id number]   cc:   Lindaann Slough, M.D.  [address]  [place]  [place] [address]  Fax: [phone]

## 2011-02-17 ENCOUNTER — Encounter: Payer: Self-pay | Admitting: Gastroenterology

## 2011-02-17 ENCOUNTER — Ambulatory Visit (INDEPENDENT_AMBULATORY_CARE_PROVIDER_SITE_OTHER): Payer: Medicare Other | Admitting: Gastroenterology

## 2011-02-17 ENCOUNTER — Other Ambulatory Visit (INDEPENDENT_AMBULATORY_CARE_PROVIDER_SITE_OTHER): Payer: Medicare Other

## 2011-02-17 ENCOUNTER — Telehealth: Payer: Self-pay | Admitting: *Deleted

## 2011-02-17 DIAGNOSIS — R6889 Other general symptoms and signs: Secondary | ICD-10-CM

## 2011-02-17 DIAGNOSIS — K219 Gastro-esophageal reflux disease without esophagitis: Secondary | ICD-10-CM

## 2011-02-17 DIAGNOSIS — D509 Iron deficiency anemia, unspecified: Secondary | ICD-10-CM

## 2011-02-17 DIAGNOSIS — R1310 Dysphagia, unspecified: Secondary | ICD-10-CM

## 2011-02-17 LAB — IBC PANEL: Iron: 77 ug/dL (ref 42–165)

## 2011-02-17 LAB — FOLATE: Folate: >24.8 ng/mL (ref >5.9)

## 2011-02-17 LAB — VITAMIN B12: Vitamin B-12: 214 pg/mL (ref 211–911)

## 2011-02-17 LAB — FERRITIN: Ferritin: 594.5 ng/mL — ABNORMAL HIGH (ref 22.0–322.0)

## 2011-02-17 MED ORDER — RABEPRAZOLE SODIUM 20 MG PO TBEC: 20.0000 mg | DELAYED_RELEASE_TABLET | Freq: Every day | ORAL | Status: DC

## 2011-02-17 NOTE — Progress Notes (Signed)
History of Present Illness:  This is a 77 year old Caucasian male referred by Dr. Joette Catching for evaluation of progressive solid food dysphagia over the last 2-3 years. He had previous ENT evaluation in 2008 Dr. Jori Moll  that included a barium swallow which showed a hiatal hernia and acid reflux. Not had previous endoscopic exam, but did have a negative colonoscopy in August of 1999. The patient denies lower gastrointestinal complaints except for more frequent stools since being on extended release Prilosec. There is no history of melena, hematochezia, anorexia, weight loss, history of hepatitis, pancreatitis, alcohol, cigarette, oriented use. Review of his multiple labs showed no significant abnormalities. He is on Glucotrol for mild adult onset diabetes. Family history is noncontributory  For Colon cancer or any gastrointestinal malignancies. Patient has had no mental status or neurological problems. He does not take pain medicine except for acetaminophen when necessary for arthritis.  I have reviewed this patient's present history, medical and surgical past history, allergies and medications.     ROS: The remainder of the 10 point ROS is negative.Marland Kitchen specifically no cardiovascular, pulmonary, genitourinary, or general medical problems.  Past Medical History  Diagnosis Date  . Type II or unspecified type diabetes mellitus without mention of complication, not stated as uncontrolled   . Hypertension   . Dysphagia   . Prostate cancer   . Anemia   . Dizziness   . Murmur   . Aortic valve disorder   . Diverticulosis of colon (without mention of hemorrhage)   . Rectal polyp   . Anxiety   . Depression   . Kidney stones    Past Surgical History  Procedure Date  . Cataract extraction   . Inguinal hernia repair   . Transurethral resection of prostate   . Prostetic prostesis     x 2  . Arm surgery     left    reports that he has never smoked. He does not have any smokeless tobacco  history on file. He reports that he does not drink alcohol or use illicit drugs. family history includes Diabetes in his brother; Heart disease in his father; Ovarian cancer in his sister; and Prostate cancer in his brother.  There is no history of Colon cancer. Allergies  Allergen Reactions  . Ciprofloxacin     REACTION: Questions rx d/t feeling bad that relieved after stopping Cipro        Physical Exam: General well developed well nourished patient in no acute distress, appearing his stated age Eyes PERRLA, no icterus, fundoscopic exam per opthamologist Skin no lesions noted Neck supple, no adenopathy, no thyroid enlargement, no tenderness Chest clear to percussion and auscultation Heart no significant murmurs, gallops or rubs noted Abdomen no hepatosplenomegaly masses or tenderness, BS normal.  Extremities no acute joint lesions, edema, phlebitis or evidence of cellulitis. Neurologic patient oriented x 3, cranial nerves intact, no focal neurologic deficits noted. Psychological mental status normal and normal affect.  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 I reviewed his x-ray report and there is no evidence of achalasia. He has mild diarrhea with omeprazole magnesium and I have changed him to AcipHex 20 mg a day. Reflux regime shown to the patient and his wife along with patient education video on reflux management. Endoscopy with esophageal dilatation has been scheduled at his convenience. The risk and benefit of this procedure have been explained in detail and he agrees to proceed. We will hold his diabetic medications the day  of his procedure. I did order serum B12 level for review. No diagnosis found.  Please copy her primary care physician, referring physician, and pertinent subspecialists.

## 2011-02-17 NOTE — Patient Instructions (Signed)
Your procedure has been scheduled for 02/24/2011, please follow the seperate instructions.  Please go to the basement today for your labs.  Stop the Prilosec and start the Aciphex samples once a day.  Today in the office you watched a movie on [place].      Upper GI Endoscopy Upper GI endoscopy means using a flexible scope to look at the esophagus, stomach and upper small bowel. This is done to make a diagnosis in people with heartburn, abdominal pain, or abnormal bleeding. Sometimes an endoscope is needed to remove foreign bodies or food that become stuck in the esophagus; it can also be used to take biopsy samples. For the best results, do not eat or drink for 8 hours before having your upper endoscopy.  To perform the endoscopy, you will probably be sedated and your throat will be numbed with a special spray. The endoscope is then slowly passed down your throat (this will not interfere with your breathing). An endoscopy exam takes 15-30 minutes to complete and there is no real pain. Patients rarely remember much about the procedure. The results of the test may take several days if a biopsy or other test is taken.  You may have a sore throat after an endoscopy exam. Serious complications are very rare. Stick to liquids and soft foods until your pain is better. You should not drive a car or operate any dangerous equipment for at least 24 hours after being sedated. SEEK IMMEDIATE MEDICAL CARE IF:  You have severe throat pain.   You have shortness of breath.   You have bleeding problems.   You have a fever.   You have difficulty recovering from your sedation.  Document Released: 10/19/2004 Document Re-Released: 12/06/2009 [facility] Patient Information 2011 [place], [place].   Acid Reflux (GERD) Acid reflux is also called gastroesophageal reflux disease (GERD). Your stomach makes acid to help digest food. Acid reflux happens when acid from your stomach goes into the tube between your mouth and  stomach (esophagus). Your stomach is protected from the acid, but this tube is not. When acid gets into the tube, it may cause a burning feeling in the chest (heartburn). Besides heartburn, other health problems can happen if the acid keeps going into the tube. Some causes of acid reflux include:  Being overweight.   Smoking.   Drinking alcohol.   Eating large meals.   Eating meals and then going to bed right away.   Eating certain foods.   Increased stomach acid production.  HOME CARE  Take all medicine as told by your doctor.   You may need to:   Lose weight.   Avoid alcohol.   Quit smoking.   Do not eat big meals. It is better to eat smaller meals throughout the day.   Do not eat a meal and then nap or go to bed.   Sleep with your head higher than your stomach.   Avoid foods that bother you.   You may need more tests, or you may need to see a special doctor.  GET HELP RIGHT AWAY IF:  You have chest pain that is different than before.   You have pain that goes to your arms, jaw, or between your shoulder blades.   You throw up (vomit) blood, dark brown liquid, or your throw up looks like coffee grounds.   You have trouble swallowing.   You have trouble breathing or cannot stop coughing.   You feel dizzy or pass out.   Your  skin is cool, wet, and pale.   Your medicine is not helping.  MAKE SURE YOU:   Understand these instructions.   Will watch your condition.   Will get help right away if you are not doing well or get worse.  Document Released: 02/28/2008 Document Re-Released: 12/06/2009 [facility] Patient Information 2011 [place], [place].

## 2011-02-17 NOTE — Telephone Encounter (Signed)
Message copied by Leonette Monarch on Fri Feb 17, 2011  1:04 PM ------      Message from: PATTERSON, DAVID R      Created: Fri Feb 17, 2011 12:08 PM       Stop any therapy, start B12 shots, and he also needs hemochromatosis genetic studies

## 2011-02-17 NOTE — Telephone Encounter (Signed)
Left message for pt to call back, order in for labs he can come on 02/22/2011 before his procedure.

## 2011-02-21 ENCOUNTER — Encounter: Payer: Self-pay | Admitting: Gastroenterology

## 2011-02-21 NOTE — Telephone Encounter (Signed)
Patient will come for labs on 02/22/2011 prior to procedure and he would like Dr Deitra Mayo office to give b12 injections i will forward this note labs and office visit to Dr Lysbeth Galas. Patient will contact Dr Deitra Mayo office to schedule injections.

## 2011-02-22 ENCOUNTER — Ambulatory Visit (AMBULATORY_SURGERY_CENTER): Payer: Medicare Other | Admitting: Gastroenterology

## 2011-02-22 ENCOUNTER — Encounter: Payer: Self-pay | Admitting: Gastroenterology

## 2011-02-22 ENCOUNTER — Other Ambulatory Visit: Payer: Medicare Other

## 2011-02-22 DIAGNOSIS — E538 Deficiency of other specified B group vitamins: Secondary | ICD-10-CM

## 2011-02-22 DIAGNOSIS — K2270 Barrett's esophagus without dysplasia: Secondary | ICD-10-CM

## 2011-02-22 DIAGNOSIS — R1310 Dysphagia, unspecified: Secondary | ICD-10-CM

## 2011-02-22 DIAGNOSIS — K222 Esophageal obstruction: Secondary | ICD-10-CM

## 2011-02-22 DIAGNOSIS — K449 Diaphragmatic hernia without obstruction or gangrene: Secondary | ICD-10-CM

## 2011-02-22 DIAGNOSIS — K219 Gastro-esophageal reflux disease without esophagitis: Secondary | ICD-10-CM

## 2011-02-22 MED ORDER — SODIUM CHLORIDE 0.9 % IV SOLN: 500.0000 mL | INTRAVENOUS | Status: DC

## 2011-02-22 NOTE — Patient Instructions (Signed)
Please read all of the handouts given to you by your recovery room nurse.   You do have a B12 deficiency and you do need B12 shots according to Dr. Jarold Motto.     Please, stay on a soft diet for this evening due to the dilation of your esophagus.   Take an acid reducer medication everyday per Dr. Norval Gable order.   He explained hemochromatosis at length with you and your family.  Please, resume your routine medications.  If you have any questions,  Please call us at [phone]. Thank-you

## 2011-02-23 ENCOUNTER — Telehealth: Payer: Self-pay | Admitting: *Deleted

## 2011-02-23 NOTE — Telephone Encounter (Signed)

## 2011-02-24 ENCOUNTER — Encounter: Payer: Medicare Other | Admitting: Gastroenterology

## 2011-02-25 LAB — HEMOCHROMATOSIS DNA-PCR(C282Y,H63D)

## 2011-02-27 ENCOUNTER — Telehealth: Payer: Self-pay

## 2011-02-27 ENCOUNTER — Other Ambulatory Visit: Payer: Self-pay | Admitting: Gastroenterology

## 2011-02-27 DIAGNOSIS — E538 Deficiency of other specified B group vitamins: Secondary | ICD-10-CM

## 2011-02-27 MED ORDER — CYANOCOBALAMIN 1000 MCG/ML IJ SOLN: INTRAMUSCULAR | Status: AC

## 2011-02-27 NOTE — Progress Notes (Signed)
Called Dr Deitra Mayo office about the b12 injections. I have faxed an order to their office for him to have injections there.

## 2011-02-27 NOTE — Telephone Encounter (Signed)
I spoke with the patient and advised him of the lab results.  He is asking for an order to be sent to Dr Lysbeth Galas for his B12.

## 2011-02-27 NOTE — Telephone Encounter (Signed)
Message copied by Annett Fabian on Mon Feb 27, 2011 10:33 AM ------      Message from: Jarold Motto, DAVID R      Created: Mon Feb 27, 2011  9:26 AM       1 GENE...KIDS NEED FASTING FERRITN LEVEL DONE.Marland KitchenMarland Kitchen

## 2011-03-24 ENCOUNTER — Ambulatory Visit (INDEPENDENT_AMBULATORY_CARE_PROVIDER_SITE_OTHER): Payer: Medicare Other | Admitting: Gastroenterology

## 2011-03-24 ENCOUNTER — Encounter: Payer: Self-pay | Admitting: Gastroenterology

## 2011-03-24 DIAGNOSIS — I350 Nonrheumatic aortic (valve) stenosis: Secondary | ICD-10-CM

## 2011-03-24 DIAGNOSIS — E538 Deficiency of other specified B group vitamins: Secondary | ICD-10-CM

## 2011-03-24 DIAGNOSIS — R1310 Dysphagia, unspecified: Secondary | ICD-10-CM | POA: Insufficient documentation

## 2011-03-24 DIAGNOSIS — I359 Nonrheumatic aortic valve disorder, unspecified: Secondary | ICD-10-CM

## 2011-03-24 DIAGNOSIS — K219 Gastro-esophageal reflux disease without esophagitis: Secondary | ICD-10-CM

## 2011-03-24 DIAGNOSIS — D509 Iron deficiency anemia, unspecified: Secondary | ICD-10-CM

## 2011-03-24 MED ORDER — RABEPRAZOLE SODIUM 20 MG PO TBEC: 20.0000 mg | DELAYED_RELEASE_TABLET | Freq: Every day | ORAL | Status: DC

## 2011-03-24 NOTE — Patient Instructions (Signed)
Your prescription(s) have been sent to you pharmacy.  Take Aciphex 20mg  one tablet by mouth once a day in the morning

## 2011-03-24 NOTE — Progress Notes (Signed)
This is a extremely pleasant 77 year old Caucasian male accompanied by his wife . 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 He has a large 10 cm hiatal hernia which makes bougie dilation was difficult. However, we were able to dilate his esophagus fairly well, and he currently is asymptomatic except for occasional dysphagia for solids. He currently is on AcipHex 20 mg a day and denies acid reflux symptoms. 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 I've advised him that he should have his son's have a fasting ferritin, iron, and TIBC level check. Patient gives no symptoms of hemachromatosis in terms of liver abnormalities, arthritis, chronic pancreatitis, etc. His appetite is good and his weight is stable. He is followed closely by Dr. Joette Catching.  Current Medications, Allergies, Past Medical History, Past Surgical History, Family History and Social History were reviewed in [employer] record.  Pertinent Review of Systems Negative... history of hypertension and he is all Diovan-HCTZ, 160-25  half a tablet a day. Also has mild glucose intolerance and takes glipizide 5 mg a day. The patient has known aortic stenosis which is noncritical, and is followed by cardiology. He denies any cardiovascular or pulmonary complaints at this time.  Physical Exam: Awake alert no acute distress appearing his stated age. Is a 3/6 systolic ejection murmur at the left lower sternal border radiated into his neck. He appears to be in a regular rhythm without an S3 gallop. His exam otherwise was deferred.    Assessment and Plan: Chronic acid reflux doing well daily on AcipHex 20 mg. I have reviewed a reflux regime with him and have advised him to eat slowly and chew his food well with plenty of liquids. He  is not a candidate for fundoplication because of his age. He is to call if he has advancing dysphagia, and perhaps repeat endoscopy with balloon dilation would be in order. His family is to be checked for hemachromatosis, he is to continue his B12 shots with Dr. Lysbeth Galas, and we will see him as needed. Encounter Diagnoses  Name Primary?  . Esophageal reflux   . Dysphagia   . Iron deficiency anemia, unspecified

## 2012-07-23 ENCOUNTER — Ambulatory Visit (HOSPITAL_COMMUNITY)
Admission: RE | Admit: 2012-07-23 | Discharge: 2012-07-23 | Disposition: A | Discharge status: Home / Self Care | Payer: Medicare Other | Source: Ambulatory Visit | Attending: Cardiology | Admitting: Cardiology

## 2012-07-23 ENCOUNTER — Encounter (HOSPITAL_COMMUNITY)
Admission: RE | Disposition: A | Discharge status: Home / Self Care | Payer: Self-pay | Source: Ambulatory Visit | Attending: Cardiology

## 2012-07-23 DIAGNOSIS — R0609 Other forms of dyspnea: Secondary | ICD-10-CM | POA: Insufficient documentation

## 2012-07-23 DIAGNOSIS — R0989 Other specified symptoms and signs involving the circulatory and respiratory systems: Secondary | ICD-10-CM | POA: Insufficient documentation

## 2012-07-23 DIAGNOSIS — I10 Essential (primary) hypertension: Secondary | ICD-10-CM | POA: Insufficient documentation

## 2012-07-23 DIAGNOSIS — I4510 Unspecified right bundle-branch block: Secondary | ICD-10-CM | POA: Insufficient documentation

## 2012-07-23 DIAGNOSIS — I2510 Atherosclerotic heart disease of native coronary artery without angina pectoris: Secondary | ICD-10-CM | POA: Insufficient documentation

## 2012-07-23 DIAGNOSIS — E119 Type 2 diabetes mellitus without complications: Secondary | ICD-10-CM | POA: Insufficient documentation

## 2012-07-23 DIAGNOSIS — I359 Nonrheumatic aortic valve disorder, unspecified: Secondary | ICD-10-CM | POA: Insufficient documentation

## 2012-07-23 DIAGNOSIS — R5381 Other malaise: Secondary | ICD-10-CM | POA: Insufficient documentation

## 2012-07-23 DIAGNOSIS — R0602 Shortness of breath: Secondary | ICD-10-CM | POA: Insufficient documentation

## 2012-07-23 HISTORY — PX: LEFT HEART CATHETERIZATION WITH CORONARY ANGIOGRAM: SHX5451

## 2012-07-23 LAB — CBC
HCT: 34.8 % — ABNORMAL LOW (ref 39.0–52.0)
Hemoglobin: 11.8 g/dL — ABNORMAL LOW (ref 13.0–17.0)
MCV: 90.4 fL (ref 78.0–100.0)
RBC: 3.85 MIL/uL — ABNORMAL LOW (ref 4.22–5.81)
WBC: 7 10*3/uL (ref 4.0–10.5)

## 2012-07-23 LAB — GLUCOSE, CAPILLARY: Glucose-Capillary: 117 mg/dL — ABNORMAL HIGH (ref 70–99)

## 2012-07-23 LAB — BASIC METABOLIC PANEL
CO2: 25 mEq/L (ref 19–32)
Chloride: 101 mEq/L (ref 96–112)
Potassium: 3.7 mEq/L (ref 3.5–5.1)
Sodium: 138 mEq/L (ref 135–145)

## 2012-07-23 LAB — APTT: aPTT: 30 seconds (ref 24–37)

## 2012-07-23 SURGERY — LEFT HEART CATHETERIZATION WITH CORONARY ANGIOGRAM
Anesthesia: LOCAL

## 2012-07-23 MED ORDER — HEPARIN SODIUM (PORCINE) 1000 UNIT/ML IJ SOLN
INTRAMUSCULAR | Status: AC
  Filled 2012-07-23: qty 1

## 2012-07-23 MED ORDER — SODIUM CHLORIDE 0.9 % IV SOLN
INTRAVENOUS | Status: DC
  Administered 2012-07-23: 1000 mL via INTRAVENOUS

## 2012-07-23 MED ORDER — SODIUM CHLORIDE 0.9 % IV SOLN: 250.0000 mL | INTRAVENOUS | Status: DC | PRN

## 2012-07-23 MED ORDER — SODIUM CHLORIDE 0.9 % IJ SOLN: 3.0000 mL | INTRAMUSCULAR | Status: DC | PRN

## 2012-07-23 MED ORDER — DIAZEPAM 5 MG PO TABS
ORAL_TABLET | ORAL | Status: AC
  Administered 2012-07-23: 5 mg via ORAL
  Filled 2012-07-23: qty 1

## 2012-07-23 MED ORDER — FENTANYL CITRATE 0.05 MG/ML IJ SOLN
INTRAMUSCULAR | Status: AC
  Filled 2012-07-23: qty 2

## 2012-07-23 MED ORDER — SODIUM CHLORIDE 0.9 % IJ SOLN: 3.0000 mL | Freq: Two times a day (BID) | INTRAMUSCULAR | Status: DC

## 2012-07-23 MED ORDER — ASPIRIN 81 MG PO CHEW
CHEWABLE_TABLET | ORAL | Status: AC
  Filled 2012-07-23: qty 4

## 2012-07-23 MED ORDER — LIDOCAINE HCL (PF) 1 % IJ SOLN
INTRAMUSCULAR | Status: AC
  Filled 2012-07-23: qty 30

## 2012-07-23 MED ORDER — HEPARIN (PORCINE) IN NACL 2-0.9 UNIT/ML-% IJ SOLN
INTRAMUSCULAR | Status: AC
  Filled 2012-07-23: qty 1000

## 2012-07-23 MED ORDER — ASPIRIN 81 MG PO CHEW
324.0000 mg | CHEWABLE_TABLET | ORAL | Status: AC
  Administered 2012-07-23: 324 mg via ORAL

## 2012-07-23 MED ORDER — NITROGLYCERIN 0.2 MG/ML ON CALL CATH LAB
INTRAVENOUS | Status: AC
  Filled 2012-07-23: qty 1

## 2012-07-23 MED ORDER — DIAZEPAM 5 MG PO TABS
5.0000 mg | ORAL_TABLET | ORAL | Status: AC
  Administered 2012-07-23: 5 mg via ORAL

## 2012-07-23 NOTE — CV Procedure (Signed)
Cath report dictated on 07/23/2012 dictation [phone]

## 2012-07-23 NOTE — H&P (Signed)
  Handwritten H&P in the chart needs to be scanned. 

## 2012-07-24 NOTE — Cardiovascular Report (Signed)
Vincent Savage, Vincent Savage NO.:  [id number]  MEDICAL RECORD NO.:  [id number]  LOCATION:  [facility]                         FACILITY:  [facility]  PHYSICIAN:  Earlyn Sylvan N. Sharyn Lull, M.D. DATE OF BIRTH:  1918/09/06  DATE OF PROCEDURE:  07/23/2012 DATE OF DISCHARGE:  07/23/2012                           CARDIAC CATHETERIZATION   PROCEDURE:  Left cardiac catheterization with selective left and right coronary angiography via right groin using Judkins technique.  INDICATION FOR THE PROCEDURE:  Mr. Brandenburg is a 78 year old white male with past medical history significant for hypertension, non-insulin- dependent diabetes mellitus, GERD, degenerative joint disease, severe aortic stenosis, complains of shortness of breath with minimal exertion associated with feeling weak, tired, and fatigued.  Denies any palpitation, lightheadedness or syncope, but occasionally feels dizzy. Denies any PND, orthopnea, or leg swelling.  Denies chest pain, but activity is limited.  Denies any weakness in the arms or legs.  PAST MEDICAL HISTORY:  As above plus history of cancer of prostate.  PAST SURGICAL HISTORY:  He had prostatectomy in the past.  MEDICATION AT HOME:  He is on aspirin, glipizide, losartan HCT, omeprazole, vitamin B12 and folic acid.  SOCIAL HISTORY:  He is married.  Retired.  No history of smoking or alcohol abuse.  PHYSICAL EXAMINATION:  GENERAL:  He is alert, awake, and oriented x3. VITAL SIGNS:  Blood pressure was 130/76, pulse was 78, regular. HEENT:  Conjunctivae was pink. NECK:  Supple.  No JVD.  No bruit. LUNGS:  Clear to auscultation without rhonchi or rales. CARDIOVASCULAR:  S1, S2 was normal.  There was 3/6 systolic murmur and faint diastolic murmur at left lower sternal border. ABDOMEN:  Soft.  Bowel sounds were present.  Nontender. EXTREMITIES:  There was no clubbing, cyanosis, or edema.  Due to exertional dyspnea, weakness, multiple risk factors, discussed with the  patient regarding left cath, possible, CABG and AVR, its risks and benefits, i.e., death, MI, stroke, local vascular complications etc., but wants to proceed with left cath and possible PTCA, stenting, refusing for CABG at this point.  The patient understands the risks and benefits, and consents for PCI.  PROCEDURE:  After obtaining the informed consent, the patient was brought to the Cath Lab and was placed on fluoroscopy table.  Right groin was prepped and draped in usual fashion.  Xylocaine 2% was used for local anesthesia in the right groin.  With the help of thin wall needle, 5-French arterial sheath was placed.  The sheath was aspirated and flushed.  Next, 5-French left Judkins catheter was advanced over the wire under fluoroscopic guidance up to the ascending aorta.  Wire was pulled out, the catheter was aspirated and connected to the Manifold. Catheter was further advanced and engaged into left coronary ostium. Multiple views of the left system were taken.  Next, the catheter was disengaged and was pulled out over the wire and was replaced with 5- [place] right Judkins catheter, which was advanced over the wire under fluoroscopic guidance up to the ascending aorta.  Wire was pulled out, the catheter was aspirated and connected to the Manifold.  Catheter was further advanced and attempted to engage into right coronary ostium  without success.  This catheter was exchanged to no-torque catheter, which was advanced over the wire under fluoroscopic guidance up to the ascending aorta.  Wire was pulled out, the catheter was aspirated and connected to the Manifold.  Catheter was further advanced and engaged into right coronary ostium.  A single view of right coronary artery was obtained.  Next, this catheter was pulled out over the wire, sheaths were aspirated and flushed.  FINDINGS:  LV was not done.  The patient had 2D echo done as outpatient, which showed severe aortic stenosis with  good LV systolic function. Left main has 25-30% distal stenosis.  LAD has 20-30% proximal stenosis. Diagonal 1 has 40-50% ostial napkin ring stenosis.  Diagonal 2 is patent.  Ramus is very very small, which is patent.  Left circumflex has 15-20% proximal and mid-stenosis.  OM 1 and OM 2 were very small.  OM 3 was moderate size, which was patent.  RCA has 15-20% proximal and mid- stenosis.  PDA is small, which is patent.  PLV branches are very small, which are patent.  The patient tolerated the procedure well.  There were no complications.  The patient was transferred to recovery room in stable condition.     Eduardo Osier. Sharyn Lull, M.D.     [facility]/MEDQ  D:  07/23/2012  T:  07/24/2012  Job:  [id number]

## 2012-07-27 ENCOUNTER — Emergency Department (INDEPENDENT_AMBULATORY_CARE_PROVIDER_SITE_OTHER)
Admission: EM | Admit: 2012-07-27 | Discharge: 2012-07-27 | Disposition: A | Discharge status: Home / Self Care | Payer: Medicare Other | Source: Home / Self Care | Attending: Family Medicine | Admitting: Family Medicine

## 2012-07-27 ENCOUNTER — Emergency Department (INDEPENDENT_AMBULATORY_CARE_PROVIDER_SITE_OTHER): Payer: Medicare Other

## 2012-07-27 ENCOUNTER — Encounter (HOSPITAL_COMMUNITY): Payer: Self-pay | Admitting: *Deleted

## 2012-07-27 DIAGNOSIS — J069 Acute upper respiratory infection, unspecified: Secondary | ICD-10-CM

## 2012-07-27 LAB — POCT URINALYSIS DIP (DEVICE)
Bilirubin Urine: NEGATIVE
Glucose, UA: NEGATIVE mg/dL
Ketones, ur: NEGATIVE mg/dL
Specific Gravity, Urine: 1.015 (ref 1.005–1.030)

## 2012-07-27 NOTE — ED Notes (Signed)
Pt  Reports  Symptoms  Of  Chills         Nausea    With  Cough     That  Developed  Today     Recently  Had  Cardiac  Cath  Which  Was  Told      Had  Valve  Problem    -  Pt  denys  Any  Chest  Pain       He  Does report    Some  l  Arm  Pain         He   Is  In no  severe  Distress  Family is  At  Bedside

## 2012-07-27 NOTE — ED Provider Notes (Signed)
History     CSN: [id number]  Arrival date & time 07/27/12  1543   First MD Initiated Contact with Patient 07/27/12 1547      Chief Complaint  Patient presents with  . Fever    (Consider location/radiation/quality/duration/timing/severity/associated sxs/prior treatment) Patient is a 78 y.o. male presenting with fever. The history is provided by the patient and the spouse.  Fever Primary symptoms of the febrile illness include fever, cough and nausea. Primary symptoms do not include shortness of breath, vomiting, diarrhea, dysuria, myalgias or rash. The current episode started today. This is a new problem.  Associated with: s/p cardiac cath on tues--no cad.    Past Medical History  Diagnosis Date  . Type II or unspecified type diabetes mellitus without mention of complication, not stated as uncontrolled   . Hypertension   . Dysphagia   . Prostate cancer   . Anemia   . Dizziness   . Murmur   . Aortic valve disorder   . Diverticulosis of colon (without mention of hemorrhage)   . Rectal polyp   . Anxiety   . Depression   . Kidney stones   . Hiatal hernia 2012  . Barrett's esophagus 2012    Past Surgical History  Procedure Date  . Cataract extraction   . Inguinal hernia repair   . Transurethral resection of prostate   . Prostetic prostesis     x 2  . Arm surgery     left    Family History  Problem Relation Age of Onset  . Ovarian cancer Sister   . Prostate cancer Brother   . Colon cancer Neg Hx   . Diabetes Brother   . Heart disease Father     History  Substance Use Topics  . Smoking status: Never Smoker   . Smokeless tobacco: Not on file  . Alcohol Use: No      Review of Systems  Constitutional: Positive for fever and chills. Negative for appetite change.  HENT: Negative.   Respiratory: Positive for cough. Negative for shortness of breath.   Cardiovascular: Negative.   Gastrointestinal: Positive for nausea. Negative for vomiting and diarrhea.    Genitourinary: Negative for dysuria.  Musculoskeletal: Negative for myalgias.  Skin: Negative for rash.    Allergies  Ciprofloxacin  Home Medications   Current Outpatient Rx  Name Route Sig Dispense Refill  . ACETAMINOPHEN ER 650 MG PO TBCR Oral Take 650 mg by mouth 2 (two) times daily.     . ASPIRIN 81 MG PO TABS Oral Take 81 mg by mouth daily.     . CYANOCOBALAMIN 1000 MCG/ML IJ SOLN  Patient wishes to have b12 injection done at Dr Deitra Mayo office I have advised him to call and set these up since I do not know the office policy.  Inject one ML IM once a week for three weeks, then inject one ML Im once a month for one year. Then Patient needs repeat labs done to recheck his b12 level. 1 mL 0  . GLIPIZIDE ER 5 MG PO TB24 Oral Take 5 mg by mouth daily.      Marland Kitchen LOSARTAN POTASSIUM-HCTZ 100-25 MG PO TABS Oral Take 1 tablet by mouth daily.    Marland Kitchen FISH OIL 1000 MG PO CAPS Oral Take 1,000 mg by mouth 2 (two) times daily.     Marland Kitchen OMEPRAZOLE 20 MG PO CPDR Oral Take 20 mg by mouth daily.      BP 162/86  Pulse 130  Temp  101.2 F (38.4 C) (Oral)  Resp 25  SpO2 93%  Physical Exam  Nursing note and vitals reviewed. Constitutional: He is oriented to person, place, and time. He appears well-developed and well-nourished.       Remarkably alert and younger than stated age.  HENT:  Head: Normocephalic.  Eyes: Pupils are equal, round, and reactive to light.  Neck: Normal range of motion. Neck supple.  Cardiovascular: Normal rate, regular rhythm and intact distal pulses.   Pulmonary/Chest: Effort normal and breath sounds normal.  Abdominal: Soft. Bowel sounds are normal. There is no tenderness. There is no rebound.  Neurological: He is alert and oriented to person, place, and time.  Skin: Skin is warm and dry.    ED Course  Procedures (including critical care time)  Labs Reviewed  POCT URINALYSIS DIP (DEVICE) - Abnormal; Notable for the following:    Hgb urine dipstick TRACE (*)     Nitrite  POSITIVE (*)     Leukocytes, UA SMALL (*)  Biochemical Testing Only. Please order routine urinalysis from main lab if confirmatory testing is needed.   All other components within normal limits   Dg Chest 2 View  07/27/2012  *RADIOLOGY REPORT*  Clinical Data: Fever.  Left sided chest pain.  CHEST - 2 VIEW  Comparison: 11/22/2009.  Findings: The cardiac silhouette, mediastinal and hilar contours are stable.  There is tortuosity and calcification of the thoracic aorta.  The lungs are clear of acute process.  Mild chronic bronchitic type lung changes.  The bony thorax is intact.  Stable calcification near the coracoid process is likely bursal.  IMPRESSION: No acute cardiopulmonary findings.   Original Report Authenticated By: Rudie Meyer, M.D.      1. URI (upper respiratory infection)       MDM  X-rays reviewed and report per radiologist. Pt had flu vaccine 2 weeks ago        Linna Hoff, MD 07/27/12 [phone]

## 2012-07-29 LAB — POCT URINALYSIS DIP (DEVICE)
Bilirubin Urine: NEGATIVE
Ketones, ur: NEGATIVE mg/dL
pH: 5 (ref 5.0–8.0)

## 2013-03-03 ENCOUNTER — Telehealth: Payer: Self-pay | Admitting: Gastroenterology

## 2013-03-03 NOTE — Telephone Encounter (Signed)
Spoke with Dr Jarold Motto who wants pt to have an OV to discuss a COLON; pt scheduled for 03/11/13 at 3pm. Pt stated understanding.

## 2013-03-11 ENCOUNTER — Ambulatory Visit: Payer: Medicare Other | Admitting: Gastroenterology

## 2013-03-11 ENCOUNTER — Other Ambulatory Visit (INDEPENDENT_AMBULATORY_CARE_PROVIDER_SITE_OTHER): Payer: Medicare Other

## 2013-03-11 ENCOUNTER — Ambulatory Visit (INDEPENDENT_AMBULATORY_CARE_PROVIDER_SITE_OTHER): Payer: Medicare Other | Admitting: Gastroenterology

## 2013-03-11 ENCOUNTER — Encounter: Payer: Self-pay | Admitting: Gastroenterology

## 2013-03-11 VITALS — BP 124/68 | HR 96 | Ht 67.0 in | Wt 171.0 lb

## 2013-03-11 DIAGNOSIS — R6889 Other general symptoms and signs: Secondary | ICD-10-CM

## 2013-03-11 DIAGNOSIS — R899 Unspecified abnormal finding in specimens from other organs, systems and tissues: Secondary | ICD-10-CM

## 2013-03-11 DIAGNOSIS — D649 Anemia, unspecified: Secondary | ICD-10-CM

## 2013-03-11 DIAGNOSIS — K5730 Diverticulosis of large intestine without perforation or abscess without bleeding: Secondary | ICD-10-CM

## 2013-03-11 DIAGNOSIS — I350 Nonrheumatic aortic (valve) stenosis: Secondary | ICD-10-CM

## 2013-03-11 DIAGNOSIS — I359 Nonrheumatic aortic valve disorder, unspecified: Secondary | ICD-10-CM

## 2013-03-11 LAB — CBC WITH DIFFERENTIAL/PLATELET
Basophils Absolute: 0 10*3/uL (ref 0.0–0.1)
Basophils Relative: 0.2 % (ref 0.0–3.0)
HCT: 33.2 % — ABNORMAL LOW (ref 39.0–52.0)
Hemoglobin: 11.3 g/dL — ABNORMAL LOW (ref 13.0–17.0)
Lymphs Abs: 1.7 10*3/uL (ref 0.7–4.0)
MCHC: 34 g/dL (ref 30.0–36.0)
Monocytes Relative: 8.3 % (ref 3.0–12.0)
Neutro Abs: 4.9 10*3/uL (ref 1.4–7.7)
RDW: 13.6 % (ref 11.5–14.6)

## 2013-03-11 NOTE — Patient Instructions (Addendum)
Your physician has requested that you go to the basement for the following lab work before leaving today: CBC Anemia Panel Celiac Panel IFOBs  We will call you with results

## 2013-03-11 NOTE — Progress Notes (Signed)
History of Present Illness:  This is a 79 year old Caucasian male with aortic stenosis and chronic congestive heart failure.  He is referred for a hemoglobin of 10.6 with apparently guaiac positive stool Hemoccult testing.  He gives a history of iron deficiency for many years and also chronic B12 deficiency.  I lasted his colonoscopy in June of 1999 he had extensive diverticulosis.  Endoscopy in May of 2012 showed a hiatal hernia, and is chronically on Prilosec therapy.  He denies any GI complaints whatsoever.  He has some mild easy fatigability and shortness of breath with exertion but denies any specific upper GI or lower GI complaints or any hepatobiliary problems.  Family history is noncontributory.  His appetite is good and his weight is stable.  He does occasionally have some loose stooling.  He has adult onset diabetes and takes glipizide 2.5 mg a day, Hyzarr -HCTZ 100-25 mg a day, and potassium replacement He also is on aspirin 81 mg a day.  I have reviewed this patient's present history, medical and surgical past history, allergies and medications.     ROS:   All systems were reviewed and are negative unless otherwise stated in the HPI.   Allergies  Allergen Reactions  . Ciprofloxacin     REACTION: Questions rx d/t feeling bad that relieved after stopping Cipro   Outpatient Prescriptions Prior to Visit  Medication Sig Dispense Refill  . acetaminophen (TYLENOL ARTHRITIS PAIN) 650 MG CR tablet Take 650 mg by mouth 2 (two) times daily.       Marland Kitchen aspirin 81 MG tablet Take 81 mg by mouth daily.       . cyanocobalamin (,VITAMIN B-12,) 1000 MCG/ML injection Patient wishes to have b12 injection done at Dr Deitra Mayo office I have advised him to call and set these up since I do not know the office policy.  Inject one ML IM once a week for three weeks, then inject one ML Im once a month for one year. Then Patient needs repeat labs done to recheck his b12 level.  1 mL  0  . losartan-hydrochlorothiazide  (HYZAAR) 100-25 MG per tablet Take 1 tablet by mouth daily.      . Omega-3 Fatty Acids (FISH OIL) 1000 MG CAPS Take 1,000 mg by mouth 2 (two) times daily.       Marland Kitchen omeprazole (PRILOSEC) 20 MG capsule Take 20 mg by mouth daily.      Marland Kitchen glipiZIDE (GLUCOTROL) 5 MG 24 hr tablet Take 5 mg by mouth daily.         No facility-administered medications prior to visit.   Past Medical History  Diagnosis Date  . Type II or unspecified type diabetes mellitus without mention of complication, not stated as uncontrolled   . Hypertension   . Dysphagia   . Prostate cancer   . Anemia   . Dizziness   . Murmur   . Aortic valve disorder   . Diverticulosis of colon (without mention of hemorrhage)   . Rectal polyp   . Anxiety   . Depression   . Kidney stones   . Hiatal hernia 2012  . Barrett's esophagus 2012   Past Surgical History  Procedure Laterality Date  . Cataract extraction    . Inguinal hernia repair    . Transurethral resection of prostate    . Prostetic prostesis      x 2  . Arm surgery      left   History   Social History  .  Marital Status: Married    Spouse Name: N/A    Number of Children: N/A  . Years of Education: N/A   Social History Main Topics  . Smoking status: Never Smoker   . Smokeless tobacco: Never Used  . Alcohol Use: No  . Drug Use: No  . Sexually Active: None   Other Topics Concern  . None   Social History Narrative  . None   Family History  Problem Relation Age of Onset  . Ovarian cancer Sister   . Prostate cancer Brother   . Colon cancer Neg Hx   . Diabetes Brother   . Heart disease Father        Physical Exam: Blood pressure 124/68, pulse 96 and regular and weight 171 with BMI of 26.78. General well developed well nourished patient in no acute distress, appearing their stated age Eyes PERRLA, no icterus, fundoscopic exam per opthamologist Skin no lesions noted Neck supple, no adenopathy, no thyroid enlargement, no tenderness Chest clear to  percussion and auscultation Heart no significant murmurs, gallops or rubs noted,,, 3/6 systolic ejection murmur into the carotids consistent with aortic stenosis.  I cannot appreciate a diastolic component or S3 gallop. Abdomen no hepatosplenomegaly masses or tenderness, BS normal.  Rectal inspection normal no fissures, or fistulae noted.  No masses or tenderness on digital exam. Stool guaiac negative. Extremities no acute joint lesions, edema, phlebitis or evidence of cellulitis. Neurologic patient oriented x 3, cranial nerves intact, no focal neurologic deficits noted. Psychological mental status normal and normal affect.  Assessment and plan: Stool today to my exam is guaiac negative.  This patient is high risk for colonoscopy exam, and Medicare has refused to pay for CT colonoscopy.  He is not a candidate for colonoscopy until cleared further by Dr.Harwani in cardiology.  I've asked him to do IFOB stool cards and we will check followup CBC iron studies and B12 level.  He appears to have a combined iron and B12 deficiency very mild anemia.  Celiac antibody panel also ordered.  I am not sure at his age if further evaluation in fact is needed.  Other considerations would be pill camera exam of his gut versus cautious observation.  He certainly has no symptoms to suggest malignancy.  As mentioned above, he is on daily aspirin therapy.  His copy this note to Dr. Marca Ancona and to Dr. Sharyn Lull. Encounter Diagnoses  Name Primary?  Marland Kitchen Anemia Yes  . Colon cancer screening   . Rectal bleeding   . Abnormal laboratory test   . Other general symptoms(780.99)

## 2013-03-12 ENCOUNTER — Telehealth: Payer: Self-pay | Admitting: *Deleted

## 2013-03-12 DIAGNOSIS — R7989 Other specified abnormal findings of blood chemistry: Secondary | ICD-10-CM

## 2013-03-12 LAB — FERRITIN: Ferritin: 777 ng/mL — ABNORMAL HIGH (ref 22.0–322.0)

## 2013-03-12 LAB — IBC PANEL
Iron: 89 ug/dL (ref 42–165)
Transferrin: 213 mg/dL (ref 212.0–360.0)

## 2013-03-12 LAB — CELIAC PANEL 10
Gliadin IgG: 8.5 U/mL (ref <20)
Tissue Transglut Ab: 12.6 U/mL (ref <20)

## 2013-03-12 LAB — VITAMIN B12: Vitamin B-12: 584 pg/mL (ref 211–911)

## 2013-03-12 NOTE — Telephone Encounter (Signed)
Message copied by Florene Glen on Wed Mar 12, 2013  3:13 PM ------      Message from: Jarold Motto, DAVID R      Created: Wed Mar 12, 2013  3:04 PM       Stop his iron and check hemochromatosis genetic studies his anemia is probably from his B12 deficiency. ------

## 2013-03-12 NOTE — Telephone Encounter (Signed)
Informed pt he needs a Hemochromatosis lab drawn d/t high iron counts. His HGB was still low, but Dr Jarold Motto feels that's d/t his B12 level. Pt reports he takes monthly B12 injections at Dr [facility]. Pt will come in for the lab and I will mail him our results.

## 2013-03-13 ENCOUNTER — Other Ambulatory Visit: Payer: Medicare Other

## 2013-03-13 ENCOUNTER — Other Ambulatory Visit (INDEPENDENT_AMBULATORY_CARE_PROVIDER_SITE_OTHER): Payer: Medicare Other

## 2013-03-13 DIAGNOSIS — D649 Anemia, unspecified: Secondary | ICD-10-CM

## 2013-03-13 DIAGNOSIS — R6889 Other general symptoms and signs: Secondary | ICD-10-CM

## 2013-03-13 DIAGNOSIS — R7989 Other specified abnormal findings of blood chemistry: Secondary | ICD-10-CM

## 2013-03-13 DIAGNOSIS — R899 Unspecified abnormal finding in specimens from other organs, systems and tissues: Secondary | ICD-10-CM

## 2013-03-13 LAB — FECAL OCCULT BLOOD, IMMUNOCHEMICAL: Fecal Occult Bld: POSITIVE

## 2013-03-18 ENCOUNTER — Telehealth: Payer: Self-pay | Admitting: *Deleted

## 2013-03-18 ENCOUNTER — Encounter: Payer: Self-pay | Admitting: Gastroenterology

## 2013-03-18 DIAGNOSIS — I350 Nonrheumatic aortic (valve) stenosis: Secondary | ICD-10-CM

## 2013-03-18 DIAGNOSIS — I5022 Chronic systolic (congestive) heart failure: Secondary | ICD-10-CM

## 2013-03-18 DIAGNOSIS — R195 Other fecal abnormalities: Secondary | ICD-10-CM

## 2013-03-18 NOTE — Telephone Encounter (Signed)
Informed pt of CT COLON appt on 04/02/13, arrive at 07:45am; GI will send pt prep instructions and directions. Pt stated understanding.

## 2013-03-18 NOTE — Telephone Encounter (Signed)
Informed pt of + guaiac stool test and he needs a Colonoscopy. Dr Jarold Motto feels d/t his age and co morbities of AS and VHF, he does not feel safe doing a COLON with sedation and feels he needs a CT COLON . Informed him Medicare has not been paying for this and he states he can't pay for > $800 at one time. Called pt after speaking with Alvino Chapel at [facility] and he will speak with her and call me back,

## 2013-03-18 NOTE — Telephone Encounter (Signed)
Message copied by Florene Glen on Tue Mar 18, 2013  9:29 AM ------      Message from: Jarold Motto, DAVID R      Created: Mon Mar 17, 2013  9:06 AM       Order CT colonoscopy and if reviewed and denied by Medicare this needs to be documented he is not [place] colonoscopy candidate with his severe aortic stenosis ------

## 2013-03-19 ENCOUNTER — Telehealth: Payer: Self-pay | Admitting: *Deleted

## 2013-03-19 NOTE — Telephone Encounter (Signed)
Mailed pt a letter with results. Pt had requested other lab results be mailed to him.

## 2013-03-19 NOTE — Telephone Encounter (Signed)
Message copied by Florene Glen on Wed Mar 19, 2013  8:17 AM ------      Message from: PATTERSON, DAVID R      Created: Tue Mar 18, 2013  4:10 PM       He is a heterozygote for hemachromatosis and does not need to take iron.  He does not need liver biopsy or further workup for iron storage disease. ------

## 2013-04-02 ENCOUNTER — Ambulatory Visit
Admission: RE | Admit: 2013-04-02 | Discharge: 2013-04-02 | Disposition: A | Discharge status: Home / Self Care | Payer: Medicare Other | Source: Ambulatory Visit | Attending: Gastroenterology | Admitting: Gastroenterology

## 2013-04-02 DIAGNOSIS — I5022 Chronic systolic (congestive) heart failure: Secondary | ICD-10-CM

## 2013-04-02 DIAGNOSIS — I350 Nonrheumatic aortic (valve) stenosis: Secondary | ICD-10-CM

## 2013-04-02 DIAGNOSIS — R195 Other fecal abnormalities: Secondary | ICD-10-CM

## 2013-04-03 ENCOUNTER — Telehealth: Payer: Self-pay | Admitting: *Deleted

## 2013-04-03 NOTE — Telephone Encounter (Signed)
Message copied by Florene Glen on Thu Apr 03, 2013  4:21 PM ------      Message from: Jarold Motto, DAVID R      Created: Wed Apr 02, 2013  3:30 PM       Please copy cardiology and his primary care per his small AAA.  His virtual colonoscopy was also excellent from a GI standpoint.  There no colonic masses or polyps noted with mild diverticulosis.  Do not think further GI evaluation needed in this 79 year old patient. ------

## 2013-04-03 NOTE — Telephone Encounter (Signed)
Informed pt and his wife of CT Colon results and that a small AAA was found. I will send this report to Dr Joette Catching , fax [phone] and Dr Sharyn Lull his Cardiologist, fax [phone]. Pt stated understanding.

## 2013-08-27 IMAGING — CR DG CHEST 2V
2 series · 2 of 2 positions shown · non-contrast
Comparison: 11/22/2009.

CLINICAL DATA: Fever.  Left sided chest pain.

CHEST - 2 VIEW

[view not recorded (1 of 2)]
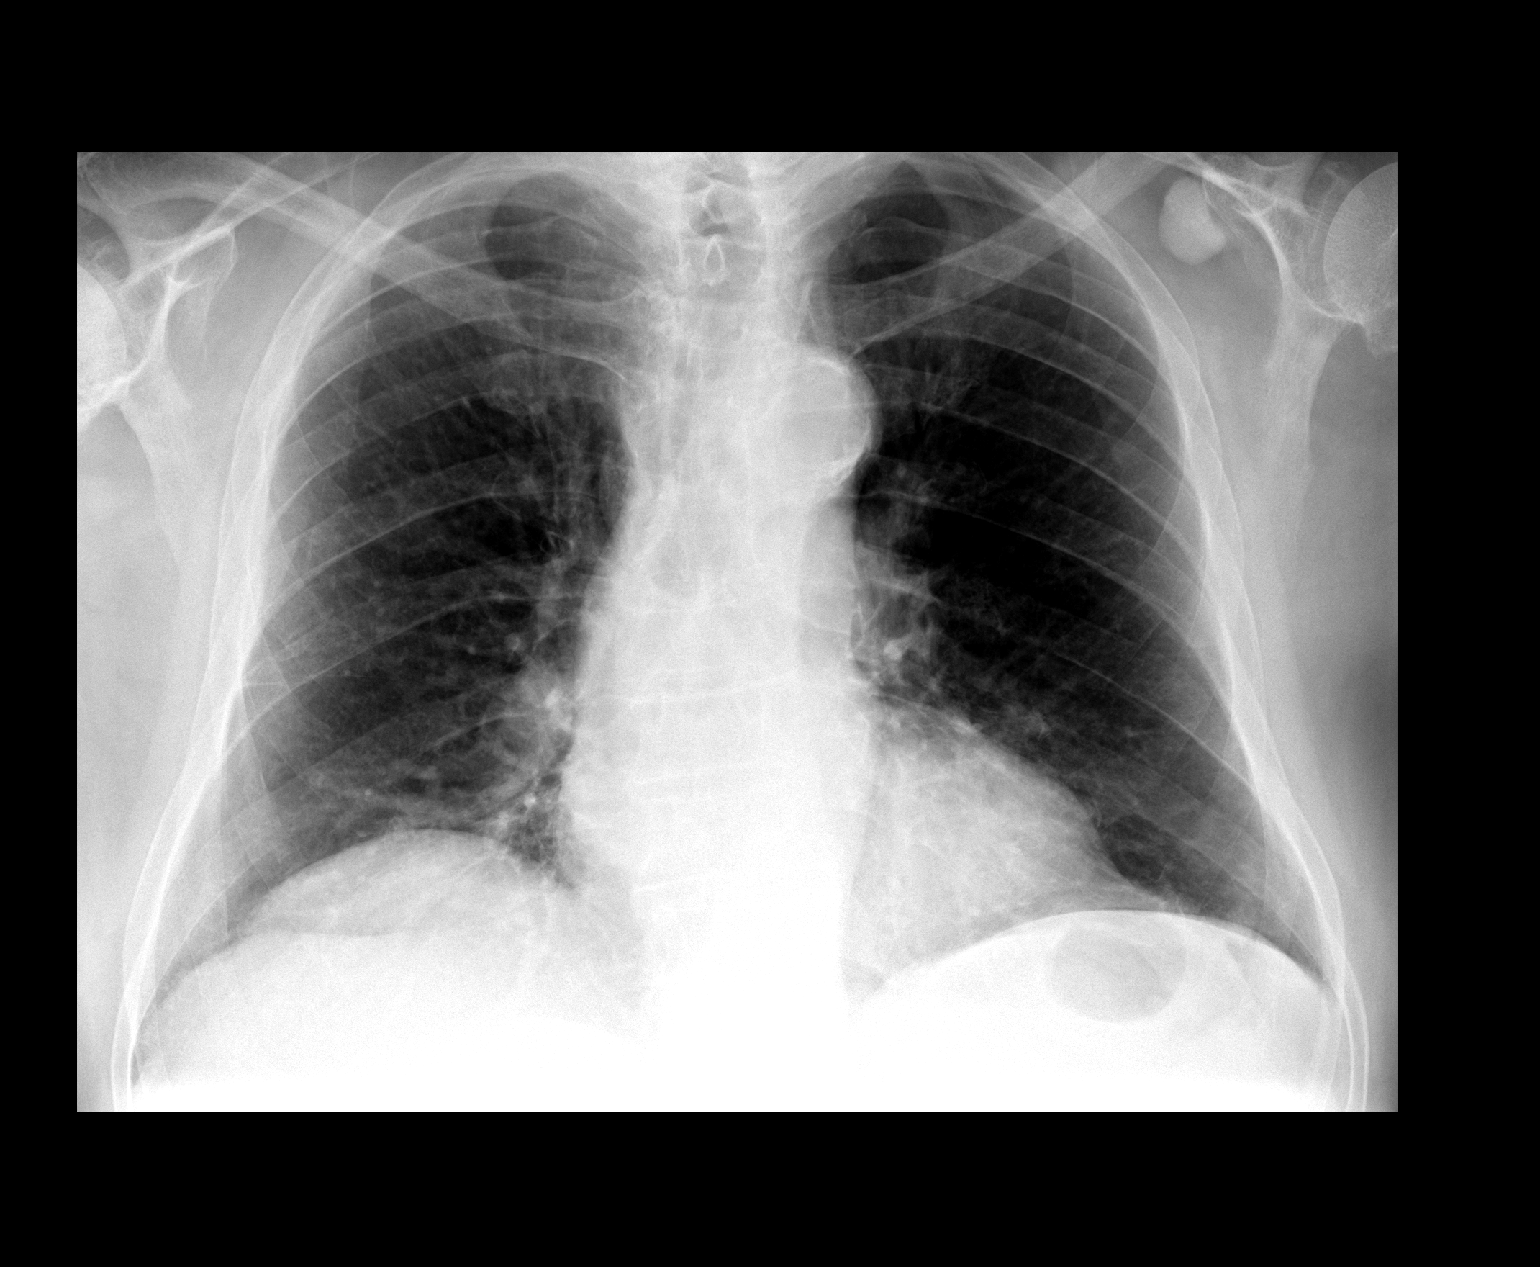

[view not recorded (2 of 2)]
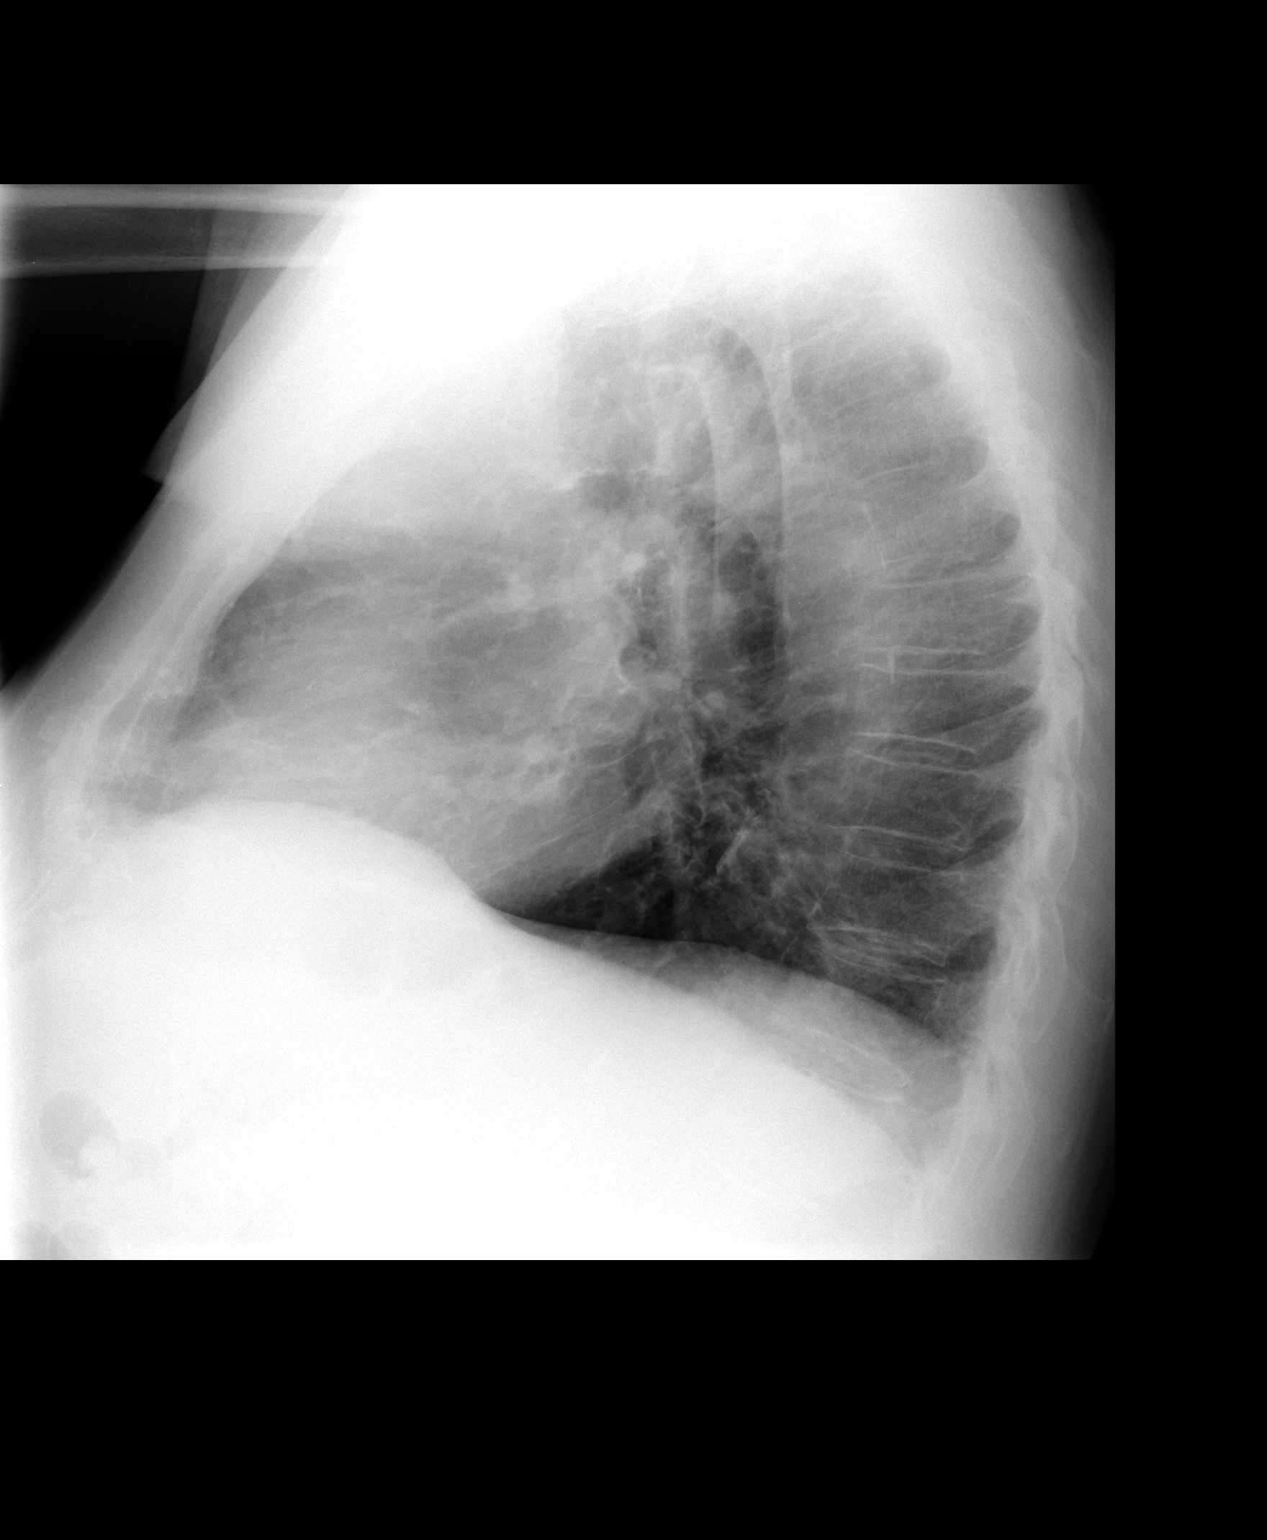

[2 of 2 positions shown; findings below may reference images not displayed]

FINDINGS: The cardiac silhouette, mediastinal and hilar contours
are stable.  There is tortuosity and calcification of the thoracic
aorta.  The lungs are clear of acute process.  Mild chronic
bronchitic type lung changes.  The bony thorax is intact.  Stable
calcification near the coracoid process is likely bursal.
IMPRESSION: No acute cardiopulmonary findings.

## 2013-09-05 ENCOUNTER — Ambulatory Visit (INDEPENDENT_AMBULATORY_CARE_PROVIDER_SITE_OTHER): Payer: Medicare Other | Admitting: Gastroenterology

## 2013-09-05 ENCOUNTER — Encounter: Payer: Self-pay | Admitting: Gastroenterology

## 2013-09-05 VITALS — BP 130/68 | HR 65 | Ht 67.0 in | Wt 170.2 lb

## 2013-09-05 DIAGNOSIS — D649 Anemia, unspecified: Secondary | ICD-10-CM

## 2013-09-05 NOTE — Progress Notes (Signed)
This is a 79 year old Caucasian male with chronic congestive heart failure multiple cardiac valve problems who has not felt to be a candidate for heart surgery by Dr. Lauro Regulus.  He is a primary care patient of Dr. Lysbeth Galas to his regime same for repeat GI evaluation for anemia and apparently a guaiac positive stool he discovered although her stools have been repeatedly guaiac-negative.  He has a chronic anemia with a hemoglobin of approximately 11 with normal iron levels, and apparently he is on B12 replacement therapy.  Actually review of his labs show elevated iron levels to 5-600 levels..  The patient continues to deny any GI complaints, specifically melena or hematochezia.  Because of these symptoms he had previous CT scan of the colon, so-called CT colonoscopy, and this was unremarkable on 04/02/13.  There is evidence of previous prostatectomy.  There were no colon polyps seen or other abnormalities.  Patient continues to deny any upper GI complaints.  Appetite is good and his weight is stable.  He currently is being treated for depression the 10 mg a day.  His wife relates this to his decreased activity level.   Current Medications, Allergies, Past Medical History, Past Surgical History, Family History and Social History were reviewed in [employer] record.  ROS: All systems were reviewed and are negative unless otherwise stated in the HPI.   Allergies  Allergen Reactions  . Ciprofloxacin     REACTION: Questions rx d/t feeling bad that relieved after stopping Cipro   Outpatient Prescriptions Prior to Visit  Medication Sig Dispense Refill  . acetaminophen (TYLENOL ARTHRITIS PAIN) 650 MG CR tablet Take 650 mg by mouth 2 (two) times daily.       Marland Kitchen amLODipine (NORVASC) 5 MG tablet Take 5 mg by mouth 2 (two) times daily.      Marland Kitchen aspirin 81 MG tablet Take 81 mg by mouth daily.       . cyanocobalamin (,VITAMIN B-12,) 1000 MCG/ML injection Patient wishes to have b12 injection done at Dr  Deitra Mayo office I have advised him to call and set these up since I do not know the office policy.  Inject one ML IM once a week for three weeks, then inject one ML Im once a month for one year. Then Patient needs repeat labs done to recheck his b12 level.  1 mL  0  . ferrous sulfate (SM IRON) 325 (65 FE) MG tablet Take 325 mg by mouth daily with breakfast.      . glipiZIDE (GLUCOTROL XL) 2.5 MG 24 hr tablet Take 2.5 mg by mouth daily.      Marland Kitchen losartan-hydrochlorothiazide (HYZAAR) 100-25 MG per tablet Take 1 tablet by mouth daily.      . Omega-3 Fatty Acids (FISH OIL) 1000 MG CAPS Take 1,000 mg by mouth 2 (two) times daily.       Marland Kitchen omeprazole (PRILOSEC) 20 MG capsule Take 20 mg by mouth daily.      . potassium chloride (KLOR-CON) 20 MEQ packet Take 20 mEq by mouth daily.       No facility-administered medications prior to visit.   Past Medical History  Diagnosis Date  . Type II or unspecified type diabetes mellitus without mention of complication, not stated as uncontrolled   . Hypertension   . Dysphagia   . Prostate cancer   . Anemia   . Dizziness   . Murmur   . Aortic valve disorder   . Diverticulosis of colon (without mention of hemorrhage)   .  Rectal polyp   . Anxiety   . Depression   . Kidney stones   . Hiatal hernia 2012  . Barrett's esophagus 2012   Past Surgical History  Procedure Laterality Date  . Cataract extraction    . Inguinal hernia repair    . Transurethral resection of prostate    . Prostetic prostesis      x 2  . Arm surgery      left   History   Social History  . Marital Status: Married    Spouse Name: N/A    Number of Children: N/A  . Years of Education: N/A   Social History Main Topics  . Smoking status: Never Smoker   . Smokeless tobacco: Never Used  . Alcohol Use: No  . Drug Use: No  . Sexual Activity: None   Other Topics Concern  . None   Social History Narrative  . None   Family History  Problem Relation Age of Onset  . Ovarian  cancer Sister   . Prostate cancer Brother   . Colon cancer Neg Hx   . Diabetes Brother   . Heart disease Father             Physical Exam: Very nice and very healthy-appearing patient in no acute distress.  Blood pressure 130/68, pulse 65 and regular and weight 170.  I cannot appreciate any significant murmurs gallops or rubs and he appears to be in a regular rhythm.  His chest is generally clear.  Abdominal exam is entirely benign without organomegaly, masses or tenderness.  Bowel sounds are normal.  Specks of rectum shows some external skin tags but otherwise is unremarkable.  Rectal exam shows no masses or tenderness with normal color stool which is guaiac negative !!!    Assessment and Plan: There is no evidence of GI blood loss from this 79 year old patient.  He had guaiac negative stools in my exam 6 months ago and also today.  If anything, his iron levels were borderline high, and I suspect he is a heterozygote for hemachromatosis.  His age, I would not suggest further workup of this problem.  Also see no need for any other further GI evaluation.  I will do Cologuard --- stool DNA test to 100% exclude colon cancer.  I reviewed his CT colonoscopy and it is unremarkable.  I've advised him to continue his medications per cardiology and primary care, and to continue his B12 shots.  In the past had acid reflux and was treated with AcipHex, but denies any acid reflux symptoms at this time.  Previous endoscopy was otherwise unremarkable, and I do not think this needs to be repeated.  Patient is on aspirin 81 mg a day which may give him a false positive stool guaiac.  In reviewing his record, he is a heterozygote for hemachromatosis.  On reviewing his record I cannot asked if determined exactly what type of valvular heart disease this patient has.  He seems well compensated from a cardiopulmonary standpoint at this time.  I do not think further GI evaluation is in order at this time.  A 79 year-old  with his hemoglobin is probably fairly normal per his other multiple medical problems.

## 2013-09-05 NOTE — Patient Instructions (Addendum)
Please follow up in one year.   The lab([facility]) will call you with information regarding your Cologuard Stool Test.

## 2014-05-03 IMAGING — CT CT VIRTUAL COLONOSCOPY DIAGNOSTIC
3 of 6 series · 12 of 36 positions shown, 18 images · non-contrast
Comparison: None.

CLINICAL DATA: Positive fecal occult blood test, history of
prostate cancer

CT VIRTUAL COLONOSCOPY DIAGNOSTIC
TECHNIQUE: The patient was given a standard mag citrate bowel
preparation with Gastrografin and barium for fluid and stool
tagging respectively.  The quality of the bowel preparation is
moderate.  Automated CO2 insufflation of the colon was performed
prior to image acquisition and colonic distention is moderate.
Image post processing was used to generate a 3D endoluminal fly-
through projection of the colon and to electronically subtract
stool/fluid as appropriate.

[Series 6: prone (id) · axial · 0.76mm/px · z∈[-468,-56]mm · 8 of 423 slices shown, 13 images (1 of 2)]
[im 47/423  soft-tissue]
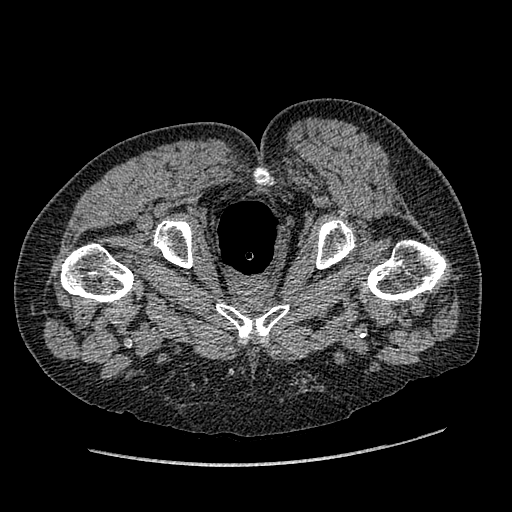
[im 47/423  bone]
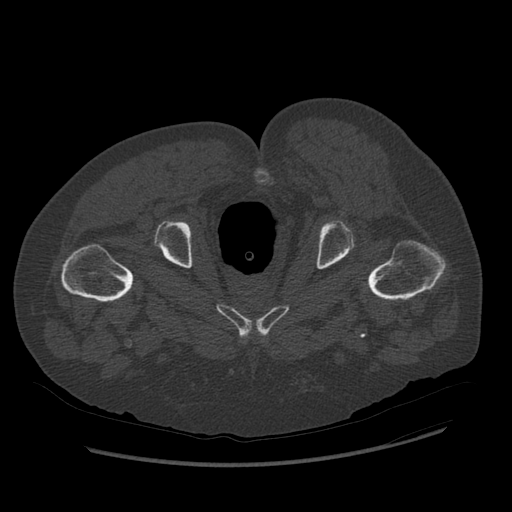
[im 94/423  soft-tissue]
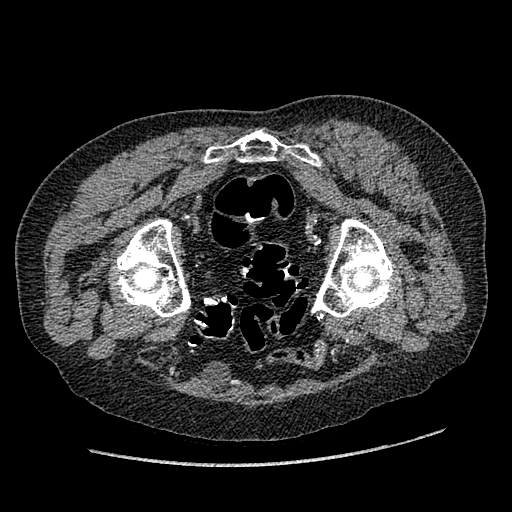
[im 141/423  soft-tissue]
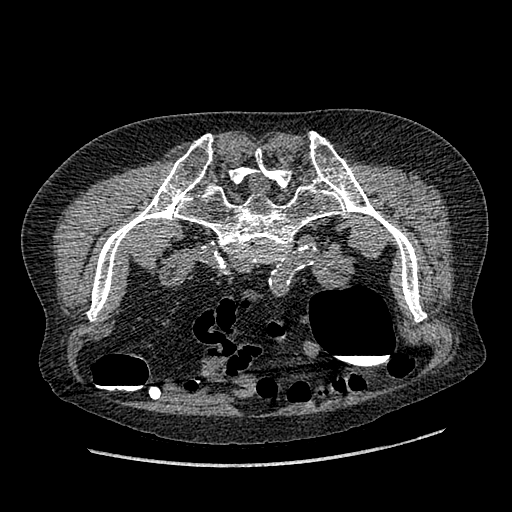
[im 188/423  soft-tissue]
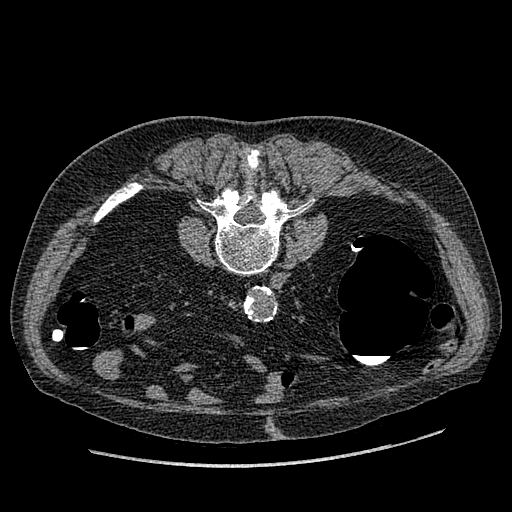
[im 235/423  soft-tissue]
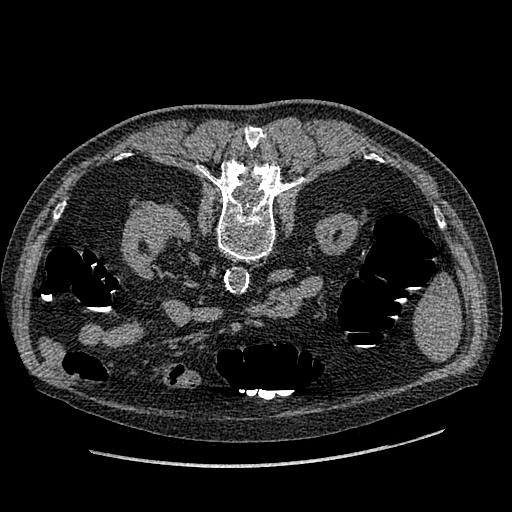
[im 235/423  lung]
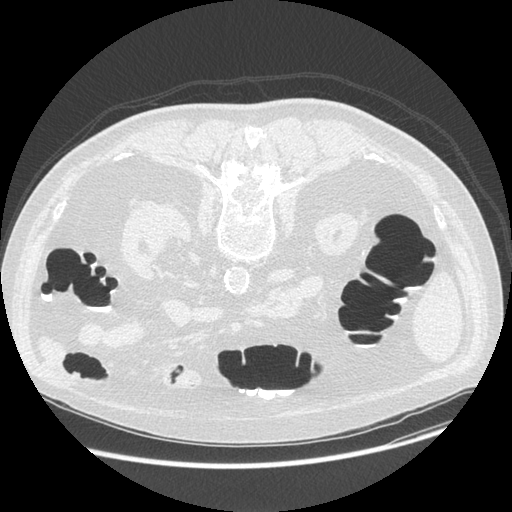
[im 282/423  soft-tissue]
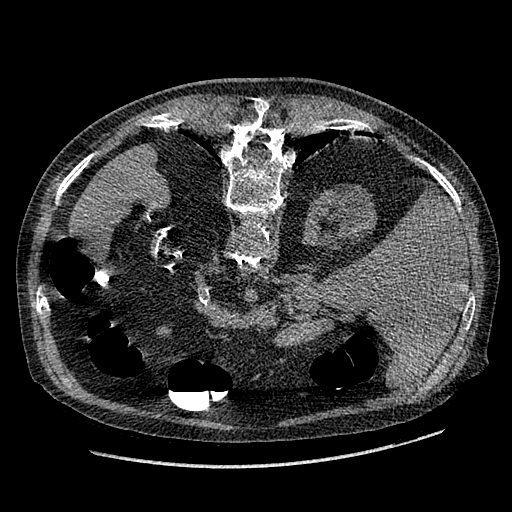
[im 282/423  lung]
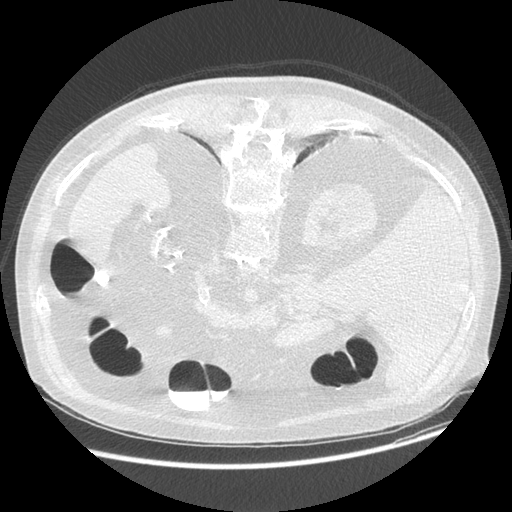
[im 329/423  soft-tissue]
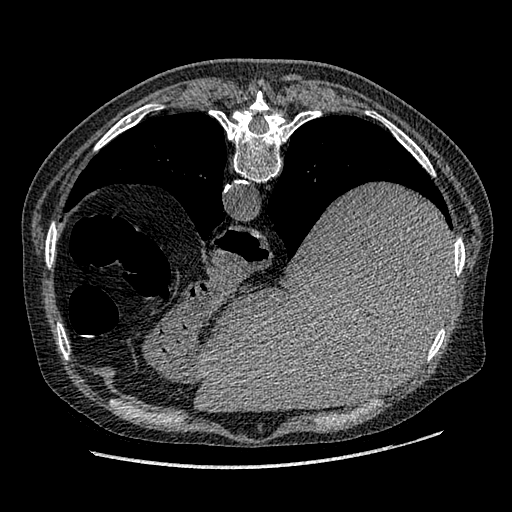
[im 329/423  lung]
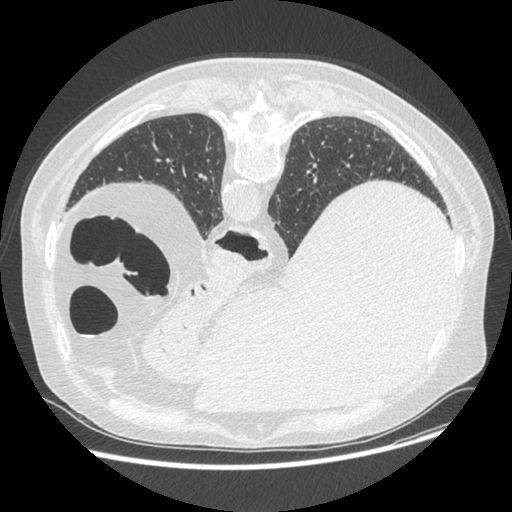
[im 376/423  soft-tissue]
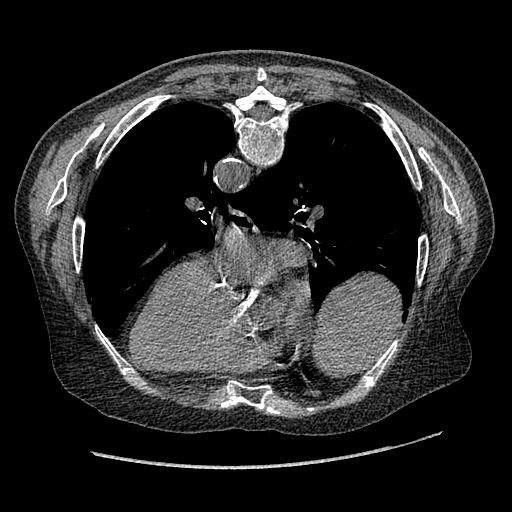
[im 376/423  lung]
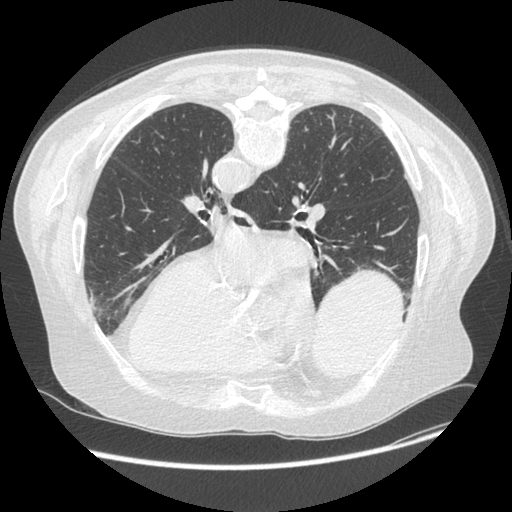

[Series 7: prone (id) · axial · 0.76mm/px · z∈[-395,-130]mm · 3 of 212 slices shown (2 of 2)]
[im 53/212  soft-tissue]
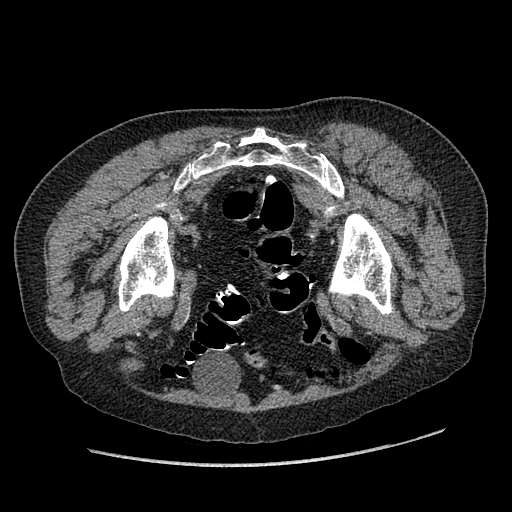
[im 106/212  soft-tissue]
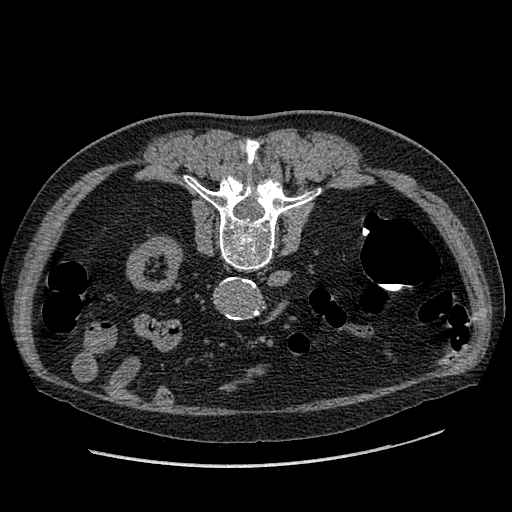
[im 159/212  soft-tissue]
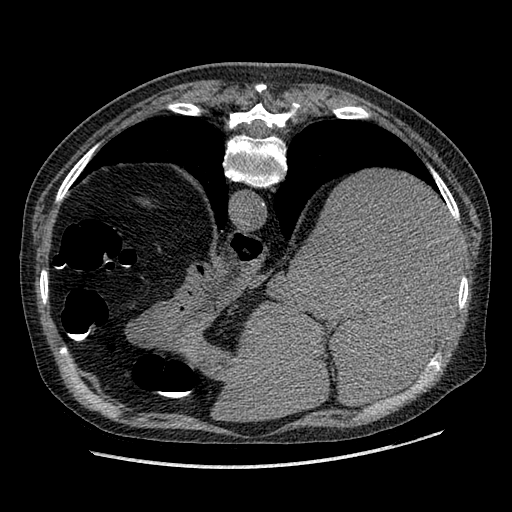

[Series 601: coronal body · coronal · 0.91mm/px · 1 of 129 slices shown, 2 images]
[im 43/129  soft-tissue]
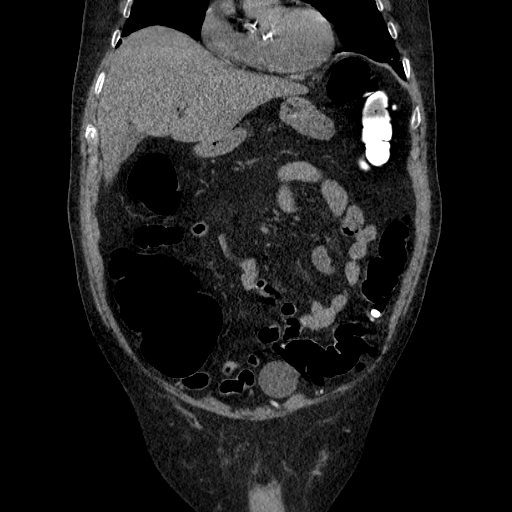
[im 43/129  bone]
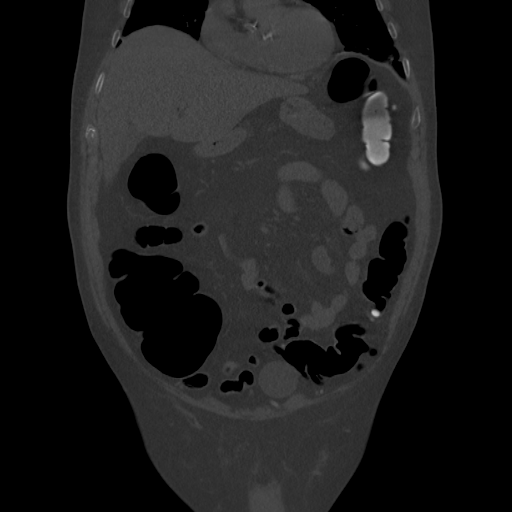

[12 of 36 positions shown; findings below may reference images not displayed]

FINDINGS: Tortuous colon with numerous colonic diverticuli.
Sigmoid colon is mildly underdistended due to muscular hypertrophy.

No colonic mass or significant colonic polyp is visualized.

No evidence of diverticulitis.

Normal opacified appendix.
IMPRESSION: No colonic mass or significant colonic polyp is visualized.

Colonic diverticulosis, without associated inflammatory changes.

Virtual colonoscopy is not designed to detect diminutive polyps
(i.e., less than or equal to 5 mm), the presence or absence of
which may not affect clinical management

CT ABDOMEN AND PELVIS WITHOUT CONTRAST
FINDINGS: Lung bases are essentially clear.

Small hiatal hernia.

Mild hepatic steatosis.

Unenhanced spleen, pancreas, adrenal glands are within normal
limits.

Gallbladder is underdistended.  No intrahepatic or extrahepatic
ductal dilatation.

3.9 cm right upper pole renal cyst.  Left kidney is unremarkable.
No hydronephrosis.

Bilobed infrarenal abdominal aortic aneurysm measuring up to 3.3 cm
in maximum AP dimension.  Extensive atherosclerotic calcification
of the aortic arch and branch vessels.

No abdominopelvic ascites.

No suspicious abdominopelvic lymphadenopathy.

Status post prostatectomy.

Bladder is decompressed.

Small fat-containing left inguinal hernia.

Penile prosthesis with reservoir in the left anterior pelvis.

Degenerative changes of the lower lumbar spine.  No focal osseous
lesions.
IMPRESSION: Bilobed infrarenal abdominal aortic aneurysm measuring up to 3.3 cm
in maximal AP dimension.

Status post prostatectomy.  No evidence of metastatic disease in
the abdomen/pelvis.

Mild hepatic steatosis.

## 2014-09-03 ENCOUNTER — Encounter (HOSPITAL_COMMUNITY): Payer: Self-pay | Admitting: Cardiology

## 2017-11-28 ENCOUNTER — Encounter: Payer: Self-pay | Admitting: *Deleted

## 2017-11-28 ENCOUNTER — Ambulatory Visit (INDEPENDENT_AMBULATORY_CARE_PROVIDER_SITE_OTHER): Payer: Medicare Other | Admitting: Vascular Surgery

## 2017-11-28 ENCOUNTER — Other Ambulatory Visit: Payer: Self-pay

## 2017-11-28 ENCOUNTER — Encounter: Payer: Self-pay | Admitting: Vascular Surgery

## 2017-11-28 DIAGNOSIS — I7025 Atherosclerosis of native arteries of other extremities with ulceration: Secondary | ICD-10-CM | POA: Diagnosis not present

## 2017-11-28 NOTE — Progress Notes (Signed)
Letter reviewed and given to patient.

## 2017-11-28 NOTE — Progress Notes (Signed)
Requested by:  [name], MD [address] [place], [place] [address]  Reason for consultation: non-healing R great toe ulcer   History of Present Illness   Vincent Savage. is a 84 y.o. ([birth year]) male who presents with chief complaint: non-healing R great toe.  Onset of symptoms occurred in September.  Pt was mowing his yard when he developed a blister on his R great toe from his shoe.  The patient initially tried Epson salt soaks and antibiotic ointment to the R great toe.  This failed to heal and the patient has been getting wound care with [name].  The patient denies pain in the R great toe.  The patient has no rest pain symptoms.  The patient denies drainage or any fever or chills.  Atherosclerotic risk factors include: DM.  Past Medical History:  Diagnosis Date  . Anemia   . Anxiety   . Aortic valve disorder   . Barrett's esophagus 2012  . Depression   . Diverticulosis of colon (without mention of hemorrhage)   . Dizziness   . Dysphagia   . Hiatal hernia 2012  . Hypertension   . Kidney stones   . Murmur   . Prostate cancer ([place])   . Rectal polyp   . Type II or unspecified type diabetes mellitus without mention of complication, not stated as uncontrolled     Past Surgical History:  Procedure Laterality Date  . arm surgery     left  . CATARACT EXTRACTION    . INGUINAL HERNIA REPAIR    . LEFT HEART CATHETERIZATION WITH CORONARY ANGIOGRAM N/A 07/23/2012   Procedure: LEFT HEART CATHETERIZATION WITH CORONARY ANGIOGRAM;  Surgeon: [name], MD;  Location: [facility];  Service: Cardiovascular;  Laterality: N/A;  . prostetic prostesis     x 2  . TRANSURETHRAL RESECTION OF PROSTATE      Social History   Socioeconomic History  . Marital status: Married    Spouse name: Not on file  . Number of children: Not on file  . Years of education: Not on file  . Highest education level: Not on file  Social Needs  . Financial resource strain: Not on file   . Food insecurity - worry: Not on file  . Food insecurity - inability: Not on file  . Transportation needs - medical: Not on file  . Transportation needs - non-medical: Not on file  Occupational History  . Not on file  Tobacco Use  . Smoking status: Never Smoker  . Smokeless tobacco: Never Used  Substance and Sexual Activity  . Alcohol use: No  . Drug use: No  . Sexual activity: Not on file  Other Topics Concern  . Not on file  Social History Narrative  . Not on file    Family History  Problem Relation Age of Onset  . Ovarian cancer Sister   . Prostate cancer Brother   . Diabetes Brother   . Heart disease Father   . Colon cancer Neg Hx     Current Outpatient Medications  Medication Sig Dispense Refill  . acetaminophen (TYLENOL ARTHRITIS PAIN) 650 MG CR tablet Take 650 mg by mouth 2 (two) times daily.     Marland Kitchen amLODipine (NORVASC) 5 MG tablet Take 5 mg by mouth 2 (two) times daily.    Marland Kitchen aspirin 81 MG tablet Take 81 mg by mouth daily.     . cyanocobalamin (,VITAMIN B-12,) 1000 MCG/ML injection Patient wishes to  have b12 injection done at Dr [name] office I have advised him to call and set these up since I do not know the office policy.  Inject one ML IM once a week for three weeks, then inject one ML Im once a month for one year. Then Patient needs repeat labs done to recheck his b12 level. 1 mL 0  . escitalopram (LEXAPRO) 10 MG tablet Take 10 mg by mouth daily.    . ferrous sulfate (SM IRON) 325 (65 FE) MG tablet Take 325 mg by mouth daily with breakfast.    . glipiZIDE (GLUCOTROL XL) 2.5 MG 24 hr tablet Take 2.5 mg by mouth daily.    Marland Kitchen losartan-hydrochlorothiazide (HYZAAR) 100-25 MG per tablet Take 1 tablet by mouth daily.    Marland Kitchen omeprazole (PRILOSEC) 20 MG capsule Take 20 mg by mouth daily.    . potassium chloride (KLOR-CON) 20 MEQ packet Take 20 mEq by mouth daily.    . Omega-3 Fatty Acids (FISH OIL) 1000 MG CAPS Take 1,000 mg by mouth 2 (two) times daily.      No current  facility-administered medications for this visit.     Allergies  Allergen Reactions  . Ciprofloxacin Other (See Comments)    REACTION: Questions rx d/t feeling bad that relieved after stopping Cipro REACTION: Questions rx d/t feeling bad that relieved after stopping Cipro    REVIEW OF SYSTEMS (negative unless checked):   Cardiac:  []  Chest pain or chest pressure? [x]  Shortness of breath upon activity? []  Shortness of breath when lying flat? [x]  Irregular heart rhythm?  Vascular:  [x]  Pain in calf, thigh, or hip brought on by walking? [x]  Pain in feet at night that wakes you up from your sleep? []  Blood clot in your veins? [x]  Leg swelling?  Pulmonary:  []  Oxygen at home? []  Productive cough? []  Wheezing?  Neurologic:  []  Sudden weakness in arms or legs? []  Sudden numbness in arms or legs? []  Sudden onset of difficult speaking or slurred speech? []  Temporary loss of vision in one eye? [x]  Problems with dizziness?  Gastrointestinal:  []  Blood in stool? []  Vomited blood?  Genitourinary:  []  Burning when urinating? []  Blood in urine?  Psychiatric:  []  Major depression  Hematologic:  []  Bleeding problems? []  Problems with blood clotting?  Dermatologic:  [x]  Rashes or ulcers?  Constitutional:  []  Fever or chills?  Ear/Nose/Throat:  []  Change in hearing? []  Nose bleeds? []  Sore throat?  Musculoskeletal:  []  Back pain? []  Joint pain? []  Muscle pain?   For VQI Use Only   PRE-ADM LIVING Home  AMB STATUS Ambulatory  CAD Sx History of MI, but no symptoms No MI within 6 months  PRIOR CHF None  STRESS TEST No    Physical Examination     Vitals:   11/28/17 1126 11/28/17 1130  BP: (!) 167/87 (!) 153/70  Pulse: 81 81  Resp: 18   Temp: 97.6 F (36.4 C)   TempSrc: Oral   SpO2: 98%   Weight: 163 lb (73.9 kg)   Height: 5\' 7"  (1.702 m)    Body mass index is 25.53 kg/m.  General Alert, O x 3, WD, Elderly, robust appearance  Head [place]/AT,     Ear/Nose/ Throat Hearing grossly intact, nares without erythema or drainage, oropharynx without Erythema or Exudate, Mallampati score: 3,   Eyes PERRLA, EOMI,    Neck Supple, mid-line trachea,    Pulmonary Sym exp, good B air movt, CTA B  Cardiac RRR, Nl  S1, S2, no Murmurs, No rubs, No S3,S4  Vascular Vessel Right Left  Radial Palpable Palpable  Brachial Palpable Palpable  Carotid Palpable, No Bruit Palpable, No Bruit  Aorta Not palpable N/A  Femoral Palpable Palpable  Popliteal Not palpable Not palpable  PT Faintly palpable Faintly palpable  DP Not palpable Not palpable    Gastro- intestinal soft, non-distended, non-tender to palpation, No guarding or rebound, no HSM, no masses, no CVAT B, No palpable prominent aortic pulse,    Musculo- skeletal M/S 5/5 throughout  , Extremities without ischemic changes except macerated skin on medial surface of R great toe with some underlying clean subcutaneous tissue, Non-pitting edema present: 1+ B, Varicosities present: small B, No Lipodermatosclerosis present  Neurologic Cranial nerves 2-12 intact , Pain and light touch intact in extremities , Motor exam as listed above  Psychiatric Judgement intact, Mood & affect appropriate for pt's clinical situation  Dermatologic See M/S exam for extremity exam, No rashes otherwise noted  Lymphatic  Palpable lymph nodes: None    Non-Invasive Vascular Imaging   Outside Physiologic study (11/12/17)  R:   ABI: 0.52  Biphasic down to popliteal  Biphasic PT  Mono DP  TBI:  0.33  L:   ABI: 0.61  Biphasic down to popliteal  Biphasic PT  Mono DP  TBI:  0.39   Outside Studies/Documentation   8 pages of outside documents were reviewed including: outpatient studies and PCP chart.   Medical Decision Making   Donnis Phaneuf. is a 84 y.o. male who presents with: R great toe non-healing ulcer, RLE critical limb ischemia, BLE moderate PAD   In this patient, his overall robustness is  greater than expect for his age.   Unfortunately, his cardiac status will likely prevent any surgical intervention.  Subsequently, every effort to re-cannulated the right dorsalis pedis should be made to give his R great toe the best chance to heal.  Given the limb threatening status of this patient, I recommend an aggressive work up including proceeding with an: Aortogram, Bilateral runoff and possible intervention. I discussed with the patient the nature of angiographic procedures, especially the limited patencies of any endovascular intervention. The patient is aware of that the risks of an angiographic procedure include but are not limited to: bleeding, infection, access site complications, embolization, rupture of treated vessel, dissection, possible need for emergent surgical intervention, and possible need for surgical procedures to treat the patient's pathology. The patient is aware of the risks and agrees to proceed.  The procedure is scheduled for: 14 MAR 19.  I discussed in depth with the patient the nature of atherosclerosis, and emphasized the importance of maximal medical management including strict control of blood pressure, blood glucose, and lipid levels, antiplatelet agents, obtaining regular exercise, and cessation of smoking.  The patient is aware that without maximal medical management the underlying atherosclerotic disease process will progress, limiting the benefit of any interventions. The patient is currently not on on statin as not medically indicated.  The patient is currently on an anti-platelet: ASA.  Thank you for allowing [place] to participate in this patient's care.   Adele Barthel, MD, FACS [facility] Office: [phone] Pager: [phone]  11/28/2017, 1:26 PM

## 2017-11-28 NOTE — H&P (View-Only) (Signed)
Requested by:  [name], MD [address] [place], [place] [address]  Reason for consultation: non-healing R great toe ulcer   History of Present Illness   Vincent Savage. is a 84 y.o. (Jan 31, 1918) male who presents with chief complaint: non-healing R great toe.  Onset of symptoms occurred in September.  Pt was mowing his yard when he developed a blister on his R great toe from his shoe.  The patient initially tried Epson salt soaks and antibiotic ointment to the R great toe.  This failed to heal and the patient has been getting wound care with [name].  The patient denies pain in the R great toe.  The patient has no rest pain symptoms.  The patient denies drainage or any fever or chills.  Atherosclerotic risk factors include: DM.  Past Medical History:  Diagnosis Date  . Anemia   . Anxiety   . Aortic valve disorder   . Barrett's esophagus 2012  . Depression   . Diverticulosis of colon (without mention of hemorrhage)   . Dizziness   . Dysphagia   . Hiatal hernia 2012  . Hypertension   . Kidney stones   . Murmur   . Prostate cancer ([facility])   . Rectal polyp   . Type II or unspecified type diabetes mellitus without mention of complication, not stated as uncontrolled     Past Surgical History:  Procedure Laterality Date  . arm surgery     left  . CATARACT EXTRACTION    . INGUINAL HERNIA REPAIR    . LEFT HEART CATHETERIZATION WITH CORONARY ANGIOGRAM N/A 07/23/2012   Procedure: LEFT HEART CATHETERIZATION WITH CORONARY ANGIOGRAM;  Surgeon: [name], MD;  Location: [facility]ATH [facility];  Service: Cardiovascular;  Laterality: N/A;  . prostetic prostesis     x 2  . TRANSURETHRAL RESECTION OF PROSTATE      Social History   Socioeconomic History  . Marital status: Married    Spouse name: Not on file  . Number of children: Not on file  . Years of education: Not on file  . Highest education level: Not on file  Social Needs  . Financial resource strain: Not on file    . Food insecurity - worry: Not on file  . Food insecurity - inability: Not on file  . Transportation needs - medical: Not on file  . Transportation needs - non-medical: Not on file  Occupational History  . Not on file  Tobacco Use  . Smoking status: Never Smoker  . Smokeless tobacco: Never Used  Substance and Sexual Activity  . Alcohol use: No  . Drug use: No  . Sexual activity: Not on file  Other Topics Concern  . Not on file  Social History Narrative  . Not on file    Family History  Problem Relation Age of Onset  . Ovarian cancer Sister   . Prostate cancer Brother   . Diabetes Brother   . Heart disease Father   . Colon cancer Neg Hx     Current Outpatient Medications  Medication Sig Dispense Refill  . acetaminophen (TYLENOL ARTHRITIS PAIN) 650 MG CR tablet Take 650 mg by mouth 2 (two) times daily.     Marland Kitchen amLODipine (NORVASC) 5 MG tablet Take 5 mg by mouth 2 (two) times daily.    Marland Kitchen aspirin 81 MG tablet Take 81 mg by mouth daily.     . cyanocobalamin (,VITAMIN B-12,) 1000 MCG/ML injection Patient wishes  to have b12 injection done at Dr [name] office I have advised him to call and set these up since I do not know the office policy.  Inject one ML IM once a week for three weeks, then inject one ML Im once a month for one year. Then Patient needs repeat labs done to recheck his b12 level. 1 mL 0  . escitalopram (LEXAPRO) 10 MG tablet Take 10 mg by mouth daily.    . ferrous sulfate (SM IRON) 325 (65 FE) MG tablet Take 325 mg by mouth daily with breakfast.    . glipiZIDE (GLUCOTROL XL) 2.5 MG 24 hr tablet Take 2.5 mg by mouth daily.    Marland Kitchen losartan-hydrochlorothiazide (HYZAAR) 100-25 MG per tablet Take 1 tablet by mouth daily.    Marland Kitchen omeprazole (PRILOSEC) 20 MG capsule Take 20 mg by mouth daily.    . potassium chloride (KLOR-CON) 20 MEQ packet Take 20 mEq by mouth daily.    . Omega-3 Fatty Acids (FISH OIL) 1000 MG CAPS Take 1,000 mg by mouth 2 (two) times daily.      No current  facility-administered medications for this visit.     Allergies  Allergen Reactions  . Ciprofloxacin Other (See Comments)    REACTION: Questions rx d/t feeling bad that relieved after stopping Cipro REACTION: Questions rx d/t feeling bad that relieved after stopping Cipro    REVIEW OF SYSTEMS (negative unless checked):   Cardiac:  []  Chest pain or chest pressure? [x]  Shortness of breath upon activity? []  Shortness of breath when lying flat? [x]  Irregular heart rhythm?  Vascular:  [x]  Pain in calf, thigh, or hip brought on by walking? [x]  Pain in feet at night that wakes you up from your sleep? []  Blood clot in your veins? [x]  Leg swelling?  Pulmonary:  []  Oxygen at home? []  Productive cough? []  Wheezing?  Neurologic:  []  Sudden weakness in arms or legs? []  Sudden numbness in arms or legs? []  Sudden onset of difficult speaking or slurred speech? []  Temporary loss of vision in one eye? [x]  Problems with dizziness?  Gastrointestinal:  []  Blood in stool? []  Vomited blood?  Genitourinary:  []  Burning when urinating? []  Blood in urine?  Psychiatric:  []  Major depression  Hematologic:  []  Bleeding problems? []  Problems with blood clotting?  Dermatologic:  [x]  Rashes or ulcers?  Constitutional:  []  Fever or chills?  Ear/Nose/Throat:  []  Change in hearing? []  Nose bleeds? []  Sore throat?  Musculoskeletal:  []  Back pain? []  Joint pain? []  Muscle pain?   For VQI Use Only   PRE-ADM LIVING Home  AMB STATUS Ambulatory  CAD Sx History of MI, but no symptoms No MI within 6 months  PRIOR CHF None  STRESS TEST No    Physical Examination     Vitals:   11/28/17 1126 11/28/17 1130  BP: (!) 167/87 (!) 153/70  Pulse: 81 81  Resp: 18   Temp: 97.6 F (36.4 C)   TempSrc: Oral   SpO2: 98%   Weight: 163 lb (73.9 kg)   Height: 5\' 7"  (1.702 m)    Body mass index is 25.53 kg/m.  General Alert, O x 3, WD, Elderly, robust appearance  Head [place]/AT,      Ear/Nose/ Throat Hearing grossly intact, nares without erythema or drainage, oropharynx without Erythema or Exudate, Mallampati score: 3,   Eyes PERRLA, EOMI,    Neck Supple, mid-line trachea,    Pulmonary Sym exp, good B air movt, CTA B  Cardiac  RRR, Nl S1, S2, no Murmurs, No rubs, No S3,S4  Vascular Vessel Right Left  Radial Palpable Palpable  Brachial Palpable Palpable  Carotid Palpable, No Bruit Palpable, No Bruit  Aorta Not palpable N/A  Femoral Palpable Palpable  Popliteal Not palpable Not palpable  PT Faintly palpable Faintly palpable  DP Not palpable Not palpable    Gastro- intestinal soft, non-distended, non-tender to palpation, No guarding or rebound, no HSM, no masses, no CVAT B, No palpable prominent aortic pulse,    Musculo- skeletal M/S 5/5 throughout  , Extremities without ischemic changes except macerated skin on medial surface of R great toe with some underlying clean subcutaneous tissue, Non-pitting edema present: 1+ B, Varicosities present: small B, No Lipodermatosclerosis present  Neurologic Cranial nerves 2-12 intact , Pain and light touch intact in extremities , Motor exam as listed above  Psychiatric Judgement intact, Mood & affect appropriate for pt's clinical situation  Dermatologic See M/S exam for extremity exam, No rashes otherwise noted  Lymphatic  Palpable lymph nodes: None    Non-Invasive Vascular Imaging   Outside Physiologic study (11/12/17)  R:   ABI: 0.52  Biphasic down to popliteal  Biphasic PT  Mono DP  TBI:  0.33  L:   ABI: 0.61  Biphasic down to popliteal  Biphasic PT  Mono DP  TBI:  0.39   Outside Studies/Documentation   8 pages of outside documents were reviewed including: outpatient studies and PCP chart.   Medical Decision Making   Chike Farrington. is a 84 y.o. male who presents with: R great toe non-healing ulcer, RLE critical limb ischemia, BLE moderate PAD   In this patient, his overall robustness is  greater than expect for his age.   Unfortunately, his cardiac status will likely prevent any surgical intervention.  Subsequently, every effort to re-cannulated the right dorsalis pedis should be made to give his R great toe the best chance to heal.  Given the limb threatening status of this patient, I recommend an aggressive work up including proceeding with an: Aortogram, Bilateral runoff and possible intervention. I discussed with the patient the nature of angiographic procedures, especially the limited patencies of any endovascular intervention. The patient is aware of that the risks of an angiographic procedure include but are not limited to: bleeding, infection, access site complications, embolization, rupture of treated vessel, dissection, possible need for emergent surgical intervention, and possible need for surgical procedures to treat the patient's pathology. The patient is aware of the risks and agrees to proceed.  The procedure is scheduled for: 14 MAR 19.  I discussed in depth with the patient the nature of atherosclerosis, and emphasized the importance of maximal medical management including strict control of blood pressure, blood glucose, and lipid levels, antiplatelet agents, obtaining regular exercise, and cessation of smoking.  The patient is aware that without maximal medical management the underlying atherosclerotic disease process will progress, limiting the benefit of any interventions. The patient is currently not on on statin as not medically indicated.  The patient is currently on an anti-platelet: ASA.  Thank you for allowing [place] to participate in this patient's care.   Adele Barthel, MD, FACS [facility] Office: [phone] Pager: [phone]  11/28/2017, 1:26 PM

## 2017-11-28 NOTE — H&P (View-Only) (Signed)
Requested by:  [name], MD [address] [place],  [address]  Reason for consultation: non-healing R great toe ulcer   History of Present Illness   Vincent Savage. is a 84 y.o. (05/05/1918) male who presents with chief complaint: non-healing R great toe.  Onset of symptoms occurred in September.  Pt was mowing his yard when he developed a blister on his R great toe from his shoe.  The patient initially tried Epson salt soaks and antibiotic ointment to the R great toe.  This failed to heal and the patient has been getting wound care with [name].  The patient denies pain in the R great toe.  The patient has no rest pain symptoms.  The patient denies drainage or any fever or chills.  Atherosclerotic risk factors include: DM.  Past Medical History:  Diagnosis Date  . Anemia   . Anxiety   . Aortic valve disorder   . Barrett's esophagus 2012  . Depression   . Diverticulosis of colon (without mention of hemorrhage)   . Dizziness   . Dysphagia   . Hiatal hernia 2012  . Hypertension   . Kidney stones   . Murmur   . Prostate cancer ([place])   . Rectal polyp   . Type II or unspecified type diabetes mellitus without mention of complication, not stated as uncontrolled     Past Surgical History:  Procedure Laterality Date  . arm surgery     left  . CATARACT EXTRACTION    . INGUINAL HERNIA REPAIR    . LEFT HEART CATHETERIZATION WITH CORONARY ANGIOGRAM N/A 07/23/2012   Procedure: LEFT HEART CATHETERIZATION WITH CORONARY ANGIOGRAM;  Surgeon: [name], MD;  Location: [facility] CATH [facility];  Service: Cardiovascular;  Laterality: N/A;  . prostetic prostesis     x 2  . TRANSURETHRAL RESECTION OF PROSTATE      Social History   Socioeconomic History  . Marital status: Married    Spouse name: Not on file  . Number of children: Not on file  . Years of education: Not on file  . Highest education level: Not on file  Social Needs  . Financial resource strain: Not on file    . Food insecurity - worry: Not on file  . Food insecurity - inability: Not on file  . Transportation needs - medical: Not on file  . Transportation needs - non-medical: Not on file  Occupational History  . Not on file  Tobacco Use  . Smoking status: Never Smoker  . Smokeless tobacco: Never Used  Substance and Sexual Activity  . Alcohol use: No  . Drug use: No  . Sexual activity: Not on file  Other Topics Concern  . Not on file  Social History Narrative  . Not on file    Family History  Problem Relation Age of Onset  . Ovarian cancer Sister   . Prostate cancer Brother   . Diabetes Brother   . Heart disease Father   . Colon cancer Neg Hx     Current Outpatient Medications  Medication Sig Dispense Refill  . acetaminophen (TYLENOL ARTHRITIS PAIN) 650 MG CR tablet Take 650 mg by mouth 2 (two) times daily.     Marland Kitchen amLODipine (NORVASC) 5 MG tablet Take 5 mg by mouth 2 (two) times daily.    Marland Kitchen aspirin 81 MG tablet Take 81 mg by mouth daily.     . cyanocobalamin (,VITAMIN B-12,) 1000 MCG/ML injection Patient wishes  to have b12 injection done at Dr [name] office I have advised him to call and set these up since I do not know the office policy.  Inject one ML IM once a week for three weeks, then inject one ML Im once a month for one year. Then Patient needs repeat labs done to recheck his b12 level. 1 mL 0  . escitalopram (LEXAPRO) 10 MG tablet Take 10 mg by mouth daily.    . ferrous sulfate (SM IRON) 325 (65 FE) MG tablet Take 325 mg by mouth daily with breakfast.    . glipiZIDE (GLUCOTROL XL) 2.5 MG 24 hr tablet Take 2.5 mg by mouth daily.    Marland Kitchen losartan-hydrochlorothiazide (HYZAAR) 100-25 MG per tablet Take 1 tablet by mouth daily.    Marland Kitchen omeprazole (PRILOSEC) 20 MG capsule Take 20 mg by mouth daily.    . potassium chloride (KLOR-CON) 20 MEQ packet Take 20 mEq by mouth daily.    . Omega-3 Fatty Acids (FISH OIL) 1000 MG CAPS Take 1,000 mg by mouth 2 (two) times daily.      No current  facility-administered medications for this visit.     Allergies  Allergen Reactions  . Ciprofloxacin Other (See Comments)    REACTION: Questions rx d/t feeling bad that relieved after stopping Cipro REACTION: Questions rx d/t feeling bad that relieved after stopping Cipro    REVIEW OF SYSTEMS (negative unless checked):   Cardiac:  []  Chest pain or chest pressure? [x]  Shortness of breath upon activity? []  Shortness of breath when lying flat? [x]  Irregular heart rhythm?  Vascular:  [x]  Pain in calf, thigh, or hip brought on by walking? [x]  Pain in feet at night that wakes you up from your sleep? []  Blood clot in your veins? [x]  Leg swelling?  Pulmonary:  []  Oxygen at home? []  Productive cough? []  Wheezing?  Neurologic:  []  Sudden weakness in arms or legs? []  Sudden numbness in arms or legs? []  Sudden onset of difficult speaking or slurred speech? []  Temporary loss of vision in one eye? [x]  Problems with dizziness?  Gastrointestinal:  []  Blood in stool? []  Vomited blood?  Genitourinary:  []  Burning when urinating? []  Blood in urine?  Psychiatric:  []  Major depression  Hematologic:  []  Bleeding problems? []  Problems with blood clotting?  Dermatologic:  [x]  Rashes or ulcers?  Constitutional:  []  Fever or chills?  Ear/Nose/Throat:  []  Change in hearing? []  Nose bleeds? []  Sore throat?  Musculoskeletal:  []  Back pain? []  Joint pain? []  Muscle pain?   For VQI Use Only   PRE-ADM LIVING Home  AMB STATUS Ambulatory  CAD Sx History of MI, but no symptoms No MI within 6 months  PRIOR CHF None  STRESS TEST No    Physical Examination     Vitals:   11/28/17 1126 11/28/17 1130  BP: (!) 167/87 (!) 153/70  Pulse: 81 81  Resp: 18   Temp: 97.6 F (36.4 C)   TempSrc: Oral   SpO2: 98%   Weight: 163 lb (73.9 kg)   Height: 5\' 7"  (1.702 m)    Body mass index is 25.53 kg/m.  General Alert, O x 3, WD, Elderly, robust appearance  Head Speed/AT,      Ear/Nose/ Throat Hearing grossly intact, nares without erythema or drainage, oropharynx without Erythema or Exudate, Mallampati score: 3,   Eyes PERRLA, EOMI,    Neck Supple, mid-line trachea,    Pulmonary Sym exp, good B air movt, CTA B  Cardiac  RRR, Nl S1, S2, no Murmurs, No rubs, No S3,S4  Vascular Vessel Right Left  Radial Palpable Palpable  Brachial Palpable Palpable  Carotid Palpable, No Bruit Palpable, No Bruit  Aorta Not palpable N/A  Femoral Palpable Palpable  Popliteal Not palpable Not palpable  PT Faintly palpable Faintly palpable  DP Not palpable Not palpable    Gastro- intestinal soft, non-distended, non-tender to palpation, No guarding or rebound, no HSM, no masses, no CVAT B, No palpable prominent aortic pulse,    Musculo- skeletal M/S 5/5 throughout  , Extremities without ischemic changes except macerated skin on medial surface of R great toe with some underlying clean subcutaneous tissue, Non-pitting edema present: 1+ B, Varicosities present: small B, No Lipodermatosclerosis present  Neurologic Cranial nerves 2-12 intact , Pain and light touch intact in extremities , Motor exam as listed above  Psychiatric Judgement intact, Mood & affect appropriate for pt's clinical situation  Dermatologic See M/S exam for extremity exam, No rashes otherwise noted  Lymphatic  Palpable lymph nodes: None    Non-Invasive Vascular Imaging   Outside Physiologic study (11/12/17)  R:   ABI: 0.52  Biphasic down to popliteal  Biphasic PT  Mono DP  TBI:  0.33  L:   ABI: 0.61  Biphasic down to popliteal  Biphasic PT  Mono DP  TBI:  0.39   Outside Studies/Documentation   8 pages of outside documents were reviewed including: outpatient studies and PCP chart.   Medical Decision Making   Vincent Savage. is a 84 y.o. male who presents with: R great toe non-healing ulcer, RLE critical limb ischemia, BLE moderate PAD   In this patient, his overall robustness is  greater than expect for his age.   Unfortunately, his cardiac status will likely prevent any surgical intervention.  Subsequently, every effort to re-cannulated the right dorsalis pedis should be made to give his R great toe the best chance to heal.  Given the limb threatening status of this patient, I recommend an aggressive work up including proceeding with an: Aortogram, Bilateral runoff and possible intervention. I discussed with the patient the nature of angiographic procedures, especially the limited patencies of any endovascular intervention. The patient is aware of that the risks of an angiographic procedure include but are not limited to: bleeding, infection, access site complications, embolization, rupture of treated vessel, dissection, possible need for emergent surgical intervention, and possible need for surgical procedures to treat the patient's pathology. The patient is aware of the risks and agrees to proceed.  The procedure is scheduled for: 14 MAR 19.  I discussed in depth with the patient the nature of atherosclerosis, and emphasized the importance of maximal medical management including strict control of blood pressure, blood glucose, and lipid levels, antiplatelet agents, obtaining regular exercise, and cessation of smoking.  The patient is aware that without maximal medical management the underlying atherosclerotic disease process will progress, limiting the benefit of any interventions. The patient is currently not on on statin as not medically indicated.  The patient is currently on an anti-platelet: ASA.  Thank you for allowing [place] to participate in this patient's care.   Adele Barthel, MD, FACS [facility] Office: [phone] Pager: [phone]  11/28/2017, 1:26 PM

## 2017-11-30 ENCOUNTER — Encounter: Payer: Medicare Other | Admitting: Vascular Surgery

## 2017-12-03 ENCOUNTER — Encounter: Payer: Self-pay | Admitting: Surgery

## 2017-12-06 ENCOUNTER — Ambulatory Visit (HOSPITAL_COMMUNITY)
Admission: RE | Admit: 2017-12-06 | Discharge: 2017-12-06 | Disposition: A | Discharge status: Home / Self Care | Payer: Medicare Other | Source: Ambulatory Visit | Attending: Vascular Surgery | Admitting: Vascular Surgery

## 2017-12-06 ENCOUNTER — Ambulatory Visit (HOSPITAL_COMMUNITY)
Admission: RE | Disposition: A | Discharge status: Home / Self Care | Payer: Self-pay | Source: Ambulatory Visit | Attending: Vascular Surgery

## 2017-12-06 ENCOUNTER — Other Ambulatory Visit: Payer: Self-pay | Admitting: *Deleted

## 2017-12-06 DIAGNOSIS — Z7984 Long term (current) use of oral hypoglycemic drugs: Secondary | ICD-10-CM | POA: Insufficient documentation

## 2017-12-06 DIAGNOSIS — I743 Embolism and thrombosis of arteries of the lower extremities: Secondary | ICD-10-CM | POA: Diagnosis not present

## 2017-12-06 DIAGNOSIS — Z8546 Personal history of malignant neoplasm of prostate: Secondary | ICD-10-CM | POA: Insufficient documentation

## 2017-12-06 DIAGNOSIS — I714 Abdominal aortic aneurysm, without rupture: Secondary | ICD-10-CM | POA: Diagnosis not present

## 2017-12-06 DIAGNOSIS — I70235 Atherosclerosis of native arteries of right leg with ulceration of other part of foot: Secondary | ICD-10-CM

## 2017-12-06 DIAGNOSIS — L97519 Non-pressure chronic ulcer of other part of right foot with unspecified severity: Secondary | ICD-10-CM | POA: Insufficient documentation

## 2017-12-06 DIAGNOSIS — Z87442 Personal history of urinary calculi: Secondary | ICD-10-CM | POA: Insufficient documentation

## 2017-12-06 DIAGNOSIS — I723 Aneurysm of iliac artery: Secondary | ICD-10-CM | POA: Insufficient documentation

## 2017-12-06 DIAGNOSIS — I10 Essential (primary) hypertension: Secondary | ICD-10-CM | POA: Diagnosis not present

## 2017-12-06 DIAGNOSIS — E11621 Type 2 diabetes mellitus with foot ulcer: Secondary | ICD-10-CM | POA: Diagnosis present

## 2017-12-06 DIAGNOSIS — D649 Anemia, unspecified: Secondary | ICD-10-CM | POA: Insufficient documentation

## 2017-12-06 DIAGNOSIS — I7025 Atherosclerosis of native arteries of other extremities with ulceration: Secondary | ICD-10-CM | POA: Diagnosis present

## 2017-12-06 DIAGNOSIS — F329 Major depressive disorder, single episode, unspecified: Secondary | ICD-10-CM | POA: Insufficient documentation

## 2017-12-06 DIAGNOSIS — Z79899 Other long term (current) drug therapy: Secondary | ICD-10-CM | POA: Insufficient documentation

## 2017-12-06 DIAGNOSIS — F419 Anxiety disorder, unspecified: Secondary | ICD-10-CM | POA: Insufficient documentation

## 2017-12-06 DIAGNOSIS — Z7982 Long term (current) use of aspirin: Secondary | ICD-10-CM | POA: Diagnosis not present

## 2017-12-06 DIAGNOSIS — Z881 Allergy status to other antibiotic agents status: Secondary | ICD-10-CM | POA: Diagnosis not present

## 2017-12-06 DIAGNOSIS — K449 Diaphragmatic hernia without obstruction or gangrene: Secondary | ICD-10-CM | POA: Diagnosis not present

## 2017-12-06 DIAGNOSIS — K2270 Barrett's esophagus without dysplasia: Secondary | ICD-10-CM | POA: Insufficient documentation

## 2017-12-06 HISTORY — PX: ABDOMINAL AORTOGRAM W/LOWER EXTREMITY: CATH118223

## 2017-12-06 LAB — POCT I-STAT, CHEM 8
BUN: 23 mg/dL — AB (ref 6–20)
CHLORIDE: 104 mmol/L (ref 101–111)
CREATININE: 1 mg/dL (ref 0.61–1.24)
Calcium, Ion: 1.17 mmol/L (ref 1.15–1.40)
Glucose, Bld: 140 mg/dL — ABNORMAL HIGH (ref 65–99)
HEMATOCRIT: 28 % — AB (ref 39.0–52.0)
Hemoglobin: 9.5 g/dL — ABNORMAL LOW (ref 13.0–17.0)
POTASSIUM: 4.1 mmol/L (ref 3.5–5.1)
SODIUM: 140 mmol/L (ref 135–145)
TCO2: 25 mmol/L (ref 22–32)

## 2017-12-06 LAB — GLUCOSE, CAPILLARY: GLUCOSE-CAPILLARY: 123 mg/dL — AB (ref 65–99)

## 2017-12-06 SURGERY — ABDOMINAL AORTOGRAM W/LOWER EXTREMITY
Anesthesia: LOCAL

## 2017-12-06 MED ORDER — LABETALOL HCL 5 MG/ML IV SOLN: 10.0000 mg | INTRAVENOUS | Status: DC | PRN

## 2017-12-06 MED ORDER — IODIXANOL 320 MG/ML IV SOLN
INTRAVENOUS | Status: DC | PRN
  Administered 2017-12-06: 80 mL via INTRAVENOUS

## 2017-12-06 MED ORDER — FENTANYL CITRATE (PF) 100 MCG/2ML IJ SOLN
INTRAMUSCULAR | Status: AC
  Filled 2017-12-06: qty 2

## 2017-12-06 MED ORDER — HYDRALAZINE HCL 20 MG/ML IJ SOLN: 5.0000 mg | INTRAMUSCULAR | Status: DC | PRN

## 2017-12-06 MED ORDER — MIDAZOLAM HCL 2 MG/2ML IJ SOLN
INTRAMUSCULAR | Status: DC | PRN
  Administered 2017-12-06: 0.5 mg via INTRAVENOUS

## 2017-12-06 MED ORDER — SODIUM CHLORIDE 0.9% FLUSH: 3.0000 mL | Freq: Two times a day (BID) | INTRAVENOUS | Status: DC

## 2017-12-06 MED ORDER — SODIUM CHLORIDE 0.9 % IV SOLN
INTRAVENOUS | Status: DC
  Administered 2017-12-06: 09:00:00 via INTRAVENOUS

## 2017-12-06 MED ORDER — SODIUM CHLORIDE 0.9% FLUSH: 3.0000 mL | INTRAVENOUS | Status: DC | PRN

## 2017-12-06 MED ORDER — HEPARIN (PORCINE) IN NACL 2-0.9 UNIT/ML-% IJ SOLN
INTRAMUSCULAR | Status: AC | PRN
  Administered 2017-12-06: 500 mL

## 2017-12-06 MED ORDER — LIDOCAINE HCL (PF) 1 % IJ SOLN
INTRAMUSCULAR | Status: DC | PRN
  Administered 2017-12-06: 19 mL

## 2017-12-06 MED ORDER — SODIUM CHLORIDE 0.9 % WEIGHT BASED INFUSION: 1.0000 mL/kg/h | INTRAVENOUS | Status: DC

## 2017-12-06 MED ORDER — SODIUM CHLORIDE 0.9 % IV SOLN: 250.0000 mL | INTRAVENOUS | Status: DC | PRN

## 2017-12-06 MED ORDER — FENTANYL CITRATE (PF) 100 MCG/2ML IJ SOLN
INTRAMUSCULAR | Status: DC | PRN
  Administered 2017-12-06: 25 ug via INTRAVENOUS

## 2017-12-06 MED ORDER — HEPARIN (PORCINE) IN NACL 2-0.9 UNIT/ML-% IJ SOLN
INTRAMUSCULAR | Status: AC
  Filled 2017-12-06: qty 1000

## 2017-12-06 MED ORDER — LIDOCAINE HCL 1 % IJ SOLN
INTRAMUSCULAR | Status: AC
  Filled 2017-12-06: qty 20

## 2017-12-06 MED ORDER — MIDAZOLAM HCL 2 MG/2ML IJ SOLN
INTRAMUSCULAR | Status: AC
  Filled 2017-12-06: qty 2

## 2017-12-06 SURGICAL SUPPLY — 13 items
CATH ANGIO 5F BER2 65CM (CATHETERS) ×1 IMPLANT
CATH OMNI FLUSH 5F 65CM (CATHETERS) ×1 IMPLANT
COVER PRB 48X5XTLSCP FOLD TPE (BAG) IMPLANT
COVER PROBE 5X48 (BAG) ×2
GLIDEWIRE ADV .035X180CM (WIRE) ×1 IMPLANT
GUIDEWIRE ANGLED .035X150CM (WIRE) ×1 IMPLANT
KIT MICROPUNCTURE NIT STIFF (SHEATH) ×1 IMPLANT
KIT PV (KITS) ×2 IMPLANT
SHEATH AVANTI 11CM 5FR (SHEATH) ×1 IMPLANT
SYR MEDRAD MARK V 150ML (SYRINGE) ×2 IMPLANT
TRANSDUCER W/STOPCOCK (MISCELLANEOUS) ×2 IMPLANT
TRAY PV CATH (CUSTOM PROCEDURE TRAY) ×2 IMPLANT
WIRE BENTSON .035X145CM (WIRE) ×2 IMPLANT

## 2017-12-06 NOTE — Op Note (Addendum)
OPERATIVE NOTE   PROCEDURE: 1.  Left common femoral artery cannulation under ultrasound guidance 2.  Placement of catheter in aorta 3.  Aortogram 4.  Second order arterial selection 5.  Right leg runoff via catheter 6.  Conscious sedation for 26 minutes  PRE-OPERATIVE DIAGNOSIS: right great toe ulcer  POST-OPERATIVE DIAGNOSIS: same as above   SURGEON: Adele Barthel, MD  ANESTHESIA: conscious sedation  ESTIMATED BLOOD LOSS: 50 cc  CONTRAST: 80 cc  FINDING(S):  Aorta: patent, abdominal aortic aneurysm evident (3.6 intraluminal diameter)   Right Left  RA patent patent  CIA Patent, aneurysm with 1.8 cm lumen patent  EIA patent patent  IIA patent patent  CFA patent   SFA Patent with >90% stenosis in proximal 1/3   PFA Patent with extensive collaterals   Pop Patent, reconstitutes from profunda femoral artery collaterals and delayed distal SFA flow, >90% stenosis in the distal popliteal artery   Trif Patent tibioperoneal trunk    AT Occludes shortly after takeoff   Pero Patent proximally, distally has short segment occlusion with collateral filling distal   PT Occluded    SPECIMEN(S):  none  INDICATIONS:   Vincent Savage. is a 84 y.o. male who presents with non-healing right great toe ulcer.  The patient presents for: aortogram, right leg runoff, and possible intervention.  I discussed with the patient the nature of angiographic procedures, especially the limited patencies of any endovascular intervention.  The patient is aware of that the risks of an angiographic procedure include but are not limited to: bleeding, infection, access site complications, renal failure, embolization, rupture of vessel, dissection, possible need for emergent surgical intervention, possible need for surgical procedures to treat the patient's pathology, and stroke and death.  The patient is aware of the risks and agrees to proceed.  DESCRIPTION: After full informed consent was obtained from  the patient, the patient was brought back to the angiography suite.  The patient was placed supine upon the angiography table and connected to cardiopulmonary monitoring equipment.  The patient was then given conscious sedation, the amounts of which are documented in the patient's chart.  A circulating radiologic technician maintained continuous monitoring of the patient's cardiopulmonary status.  Additionally, the control room radiologic technician provided backup monitoring throughout the procedure.  The patient was prepped and drape in the standard fashion for an angiographic procedure.  At this point, attention was turned to the left groin.  Under ultrasound guidance, the subcutaneous tissue surrounding the left common femoral artery was anesthesized with 1% lidocaine with epinephrine.  The artery was then cannulated with a micropuncture needle.  The microwire was advanced into the iliac arterial system.  The needle was exchanged for a microsheath, which was loaded into the common femoral artery over the wire.  The microwire was exchanged for a Bentson wire which was advanced into the left common iliac artery.  The microsheath was then exchanged for a 5-Fr sheath which was loaded into the common femoral artery.  The wire would not advance into aorta despite multiple manipulations with the aid a BER-2 catheter and Omniflush catheter.  I did a hand injection through the BER-2 in the common iliac artery to image the flow pattern.  This demonstrated extensive common iliac artery calcification but presence of a patent artery into the aorta with had a small abdominal aortic aneurysm.  I exchanged the wire for a Glidewire and managed to steer into the suprarenal aorta.  I exchanged the catheter for a Omniflush  catheter.  The Omniflush catheter was then loaded over the wire up to the level of L1.  The catheter was connected to the power injector circuit.  After de-airring and de-clotting the circuit, a power  injector aortogram was completed.  The findings are listed above.  I pulled the catheter down to the distal aorta.  I could not get into the right common iliac artery despite multiple manipulations due to the heavy calcification.  I exchanged the catheter for the BER-2 and was able to get into the right common iliac artery.  With great difficulty, I eventually got into the external iliac artery due to the tortuousity and calcification.  The catheter was connected to the power injector circuit.  An automated right leg runoff was completed.  The findings are listed above.  Based on the images, I would attempt in the hybrid OR: an antegrade cannulation, orbital atherectomy and angioplasty of right superficial femoral artery and popliteal artery and angioplasty of distal peroneal artery if the occlusion can be crossed easily.    I replaced the wire into the catheter, straightening out the crook in the catheter.  Both were removed from the sheath together.  The sheath was aspirated.  No clots were present and the sheath was reloaded with heparinized saline.     COMPLICATIONS: none  CONDITION: stable   Adele Barthel, MD, [facility] Office: [phone] Pager: [phone]  12/06/2017, 10:24 AM

## 2017-12-06 NOTE — Progress Notes (Addendum)
Site area: LFA Site Prior to Removal:  Level 0 Pressure Applied For:25 min Manual: yes   Patient Status During Pull:  stable Post Pull Site:  Level 0 Post Pull Instructions Given: yes  Post Pull Pulses Present: doppler Dressing Applied:  tegaderm Bedrest begins @ 1110 till 1510 Comments:

## 2017-12-06 NOTE — Discharge Instructions (Signed)
**Note Beya Tipps-identified via Obfuscation** Femoral Site Care °Refer to this sheet in the next few weeks. These instructions provide you with information about caring for yourself after your procedure. Your health care provider may also give you more specific instructions. Your treatment has been planned according to current medical practices, but problems sometimes occur. Call your health care provider if you have any problems or questions after your procedure. °What can I expect after the procedure? °After your procedure, it is typical to have the following: °· Bruising at the site that usually fades within 1-2 weeks. °· Blood collecting in the tissue (hematoma) that may be painful to the touch. It should usually decrease in size and tenderness within 1-2 weeks. ° °Follow these instructions at home: °· Take medicines only as directed by your health care provider. °· You may shower 24-48 hours after the procedure or as directed by your health care provider. Remove the bandage (dressing) and gently wash the site with plain soap and water. Pat the area dry with a clean towel. Do not rub the site, because this may cause bleeding. °· Do not take baths, swim, or use a hot tub until your health care provider approves. °· Check your insertion site every day for redness, swelling, or drainage. °· Do not apply powder or lotion to the site. °· Limit use of stairs to twice a day for the first 2-3 days or as directed by your health care provider. °· Do not squat for the first 2-3 days or as directed by your health care provider. °· Do not lift over 10 lb (4.5 kg) for 5 days after your procedure or as directed by your health care provider. °· Ask your health care provider when it is okay to: °? Return to work or school. °? Resume usual physical activities or sports. °? Resume sexual activity. °· Do not drive home if you are discharged the same day as the procedure. Have someone else drive you. °· You may drive 24 hours after the procedure unless otherwise instructed by  your health care provider. °· Do not operate machinery or power tools for 24 hours after the procedure or as directed by your health care provider. °· If your procedure was done as an outpatient procedure, which means that you went home the same day as your procedure, a responsible adult should be with you for the first 24 hours after you arrive home. °· Keep all follow-up visits as directed by your health care provider. This is important. °Contact a health care provider if: °· You have a fever. °· You have chills. °· You have increased bleeding from the site. Hold pressure on the site. °Get help right away if: °· You have unusual pain at the site. °· You have redness, warmth, or swelling at the site. °· You have drainage (other than a small amount of blood on the dressing) from the site. °· The site is bleeding, and the bleeding does not stop after 30 minutes of holding steady pressure on the site. °· Your leg or foot becomes pale, cool, tingly, or numb. °This information is not intended to replace advice given to you by your health care provider. Make sure you discuss any questions you have with your health care provider. °Document Released: 05/15/2014 Document Revised: 02/17/2016 Document Reviewed: 03/31/2014 °Elsevier Interactive Patient Education © 2018 [employer] ° °

## 2017-12-06 NOTE — Interval H&P Note (Signed)
History and Physical Interval Note:  12/06/2017 9:07 AM  Vincent Siren Sr.  has presented today for surgery, with the diagnosis of pad  The various methods of treatment have been discussed with the patient and family. After consideration of risks, benefits and other options for treatment, the patient has consented to  Procedure(s): ABDOMINAL AORTOGRAM W/LOWER EXTREMITY (N/A) as a surgical intervention .  The patient's history has been reviewed, patient examined, no change in status, stable for surgery.  I have reviewed the patient's chart and labs.  Questions were answered to the patient's satisfaction.     Adele Barthel

## 2017-12-07 ENCOUNTER — Telehealth: Payer: Self-pay | Admitting: *Deleted

## 2017-12-07 ENCOUNTER — Encounter (HOSPITAL_COMMUNITY): Payer: Self-pay | Admitting: Vascular Surgery

## 2017-12-07 MED FILL — Lidocaine HCl Local Inj 1%: INTRAMUSCULAR | Qty: 20 | Status: AC

## 2017-12-07 NOTE — Telephone Encounter (Signed)
Call to patient instructed to be at [facility] at 11:30 am on 12/20/17 for surgery. NPO past MN night prior and expect a call and follow the detailed instructions received from the hospital pre-admission department about this surgery. Verbalized understanding.

## 2017-12-19 ENCOUNTER — Other Ambulatory Visit: Payer: Self-pay

## 2017-12-19 ENCOUNTER — Encounter (HOSPITAL_COMMUNITY): Payer: Self-pay | Admitting: *Deleted

## 2017-12-19 NOTE — Progress Notes (Signed)
Pt denies any acute cardiopulmonary issues. Pt stated that he is under the care of Dr. Terrence Dupont, Cardiology. Pt stated that Dr. Terrence Dupont stated " I can't be put to sleep for the surgery."  Requested LOV note, cardiac cath and echo from Dr. Terrence Dupont. Pt stated that a chest x ray was performed at [facility] (in [place]); records requested. Pt made aware to stop taking vitamins, fish oil, and herbal medications. Do not take any NSAIDs ie: Ibuprofen, Advil, Naproxen (Aleve), Motrin, BC and Goody Powder. Pt stated that he saw on television that taking Aspirin is not necessary. Pt advised to consult with his doctor before stopping any medications. Pt verbalized understanding of all pre-op instructions. Anesthesia asked to review pt history.

## 2017-12-19 NOTE — Progress Notes (Signed)
Anesthesia Chart Review:  Pt is a same day work up  Pt is a 84 year old male scheduled for R antegrade cannulation of R superficial femoral artery and popliteal orbital atherectomy angioplasty on 12/20/2017 with Adele Barthel, MD  Pt reports Dr. Terrence Dupont told him "he cannot be put to sleep" for this surgery.  I spoke with Dr. Terrence Dupont who is uncomfortable with pt having surgery of any kind given his advanced age and illnesses.   - PCP is Dione Housekeeper, MD (notes in care everywhere)  - Cardiologist is Charolette Forward, MD.   PMH includes:  CAD (mild by 2013 cath), CHF, aortic stenosis, HTN, DM, anemia, prostate cancer, GERD. Never smoker. BMI 26  Medications include: Amlodipine, ASA 81 mg, losartan-HCTZ, Prilosec  Labs will be obtained day of surgery  EKG 12/06/17: NSR. LAD. RBBB.   Echo 08/31/16 (Dr. Rhae Lerner office): 1.  Normal LV size.  LV systolic function normal with normal EF 50-55%.  LV shows normal contractility in size.  LVH seen with abnormal diastolic function noted. 2.  Mild mitral regurgitation. 3.  Severe aortic stenosis is present.  Severe calcification of AV leaflets.  Valve valve area approximately 0.87 cm.  Normal peak gradient 67 mmHg.  Mean gradient 42 mmHg.  Mild aortic regurgitation seen. 4.  Trace pulmonic regurgitation. 5.  Mild tricuspid regurgitation.   6.  No intracardiac shunting found. 7.  No intracardiac masses or thrombi. 8.  No pericardial effusion is seen  Cardiac cath 07/23/12:  - Left main has 25-30% distal stenosis.   - LAD has 20-30% proximal stenosis. D1 has 40-50% ostial napkin ring stenosis.  D2 is patent.   - Ramus is very very small, which is patent.   - Left CX has 15-20% proximal and mid-stenosis.  OM 1 and OM 2 were very small.  OM 3 was moderate size, which was patent.   - RCA has 15-20% proximal and mid-stenosis.  PDA is small, which is patent.  PLV branches are very small, which are patent.  Reviewed case with Dr. Glennon Mac and Dr. Fransisco Beau.  They  request Dr. Bridgett Larsson reach out to Dr. Terrence Dupont and have a discussion about patient proceeding with surgery.  I notified Becky and Dr. Lianne Moris office.   Willeen Cass, FNP-BC [facility]/Anesthesiology Phone: [phone] 12/19/2017 4:44 PM

## 2017-12-20 ENCOUNTER — Encounter (HOSPITAL_COMMUNITY)
Admission: RE | Disposition: A | Discharge status: Home / Self Care | Payer: Self-pay | Source: Ambulatory Visit | Attending: Vascular Surgery

## 2017-12-20 ENCOUNTER — Inpatient Hospital Stay (HOSPITAL_COMMUNITY): Payer: Medicare Other | Admitting: Emergency Medicine

## 2017-12-20 ENCOUNTER — Encounter (HOSPITAL_COMMUNITY): Payer: Self-pay | Admitting: Certified Registered"

## 2017-12-20 ENCOUNTER — Ambulatory Visit (HOSPITAL_COMMUNITY)
Admission: RE | Admit: 2017-12-20 | Discharge: 2017-12-20 | Disposition: A | Discharge status: Home / Self Care | Payer: Medicare Other | Source: Ambulatory Visit | Attending: Vascular Surgery | Admitting: Vascular Surgery

## 2017-12-20 DIAGNOSIS — F419 Anxiety disorder, unspecified: Secondary | ICD-10-CM | POA: Diagnosis not present

## 2017-12-20 DIAGNOSIS — K219 Gastro-esophageal reflux disease without esophagitis: Secondary | ICD-10-CM | POA: Diagnosis not present

## 2017-12-20 DIAGNOSIS — E11621 Type 2 diabetes mellitus with foot ulcer: Secondary | ICD-10-CM | POA: Diagnosis not present

## 2017-12-20 DIAGNOSIS — I7092 Chronic total occlusion of artery of the extremities: Secondary | ICD-10-CM | POA: Insufficient documentation

## 2017-12-20 DIAGNOSIS — I2510 Atherosclerotic heart disease of native coronary artery without angina pectoris: Secondary | ICD-10-CM | POA: Diagnosis not present

## 2017-12-20 DIAGNOSIS — E1151 Type 2 diabetes mellitus with diabetic peripheral angiopathy without gangrene: Secondary | ICD-10-CM | POA: Diagnosis not present

## 2017-12-20 DIAGNOSIS — M1990 Unspecified osteoarthritis, unspecified site: Secondary | ICD-10-CM | POA: Diagnosis not present

## 2017-12-20 DIAGNOSIS — I509 Heart failure, unspecified: Secondary | ICD-10-CM | POA: Insufficient documentation

## 2017-12-20 DIAGNOSIS — I4510 Unspecified right bundle-branch block: Secondary | ICD-10-CM | POA: Insufficient documentation

## 2017-12-20 DIAGNOSIS — F329 Major depressive disorder, single episode, unspecified: Secondary | ICD-10-CM | POA: Diagnosis not present

## 2017-12-20 DIAGNOSIS — L97519 Non-pressure chronic ulcer of other part of right foot with unspecified severity: Secondary | ICD-10-CM | POA: Insufficient documentation

## 2017-12-20 DIAGNOSIS — Z7982 Long term (current) use of aspirin: Secondary | ICD-10-CM | POA: Diagnosis not present

## 2017-12-20 DIAGNOSIS — D649 Anemia, unspecified: Secondary | ICD-10-CM | POA: Diagnosis not present

## 2017-12-20 DIAGNOSIS — I110 Hypertensive heart disease with heart failure: Secondary | ICD-10-CM | POA: Diagnosis not present

## 2017-12-20 DIAGNOSIS — I70235 Atherosclerosis of native arteries of right leg with ulceration of other part of foot: Secondary | ICD-10-CM | POA: Diagnosis not present

## 2017-12-20 HISTORY — DX: Pneumonia, unspecified organism: J18.9

## 2017-12-20 HISTORY — DX: Atherosclerotic heart disease of native coronary artery without angina pectoris: I25.10

## 2017-12-20 HISTORY — DX: Heart failure, unspecified: I50.9

## 2017-12-20 HISTORY — DX: Gastro-esophageal reflux disease without esophagitis: K21.9

## 2017-12-20 HISTORY — DX: Unspecified osteoarthritis, unspecified site: M19.90

## 2017-12-20 HISTORY — DX: Dyspnea, unspecified: R06.00

## 2017-12-20 HISTORY — PX: ILIAC ATHERECTOMY: SHX6328

## 2017-12-20 LAB — COMPREHENSIVE METABOLIC PANEL
ALK PHOS: 43 U/L (ref 38–126)
ALT: 15 U/L — AB (ref 17–63)
AST: 21 U/L (ref 15–41)
Albumin: 3.9 g/dL (ref 3.5–5.0)
Anion gap: 11 (ref 5–15)
BUN: 26 mg/dL — ABNORMAL HIGH (ref 6–20)
CALCIUM: 9.2 mg/dL (ref 8.9–10.3)
CO2: 23 mmol/L (ref 22–32)
CREATININE: 0.98 mg/dL (ref 0.61–1.24)
Chloride: 105 mmol/L (ref 101–111)
GFR calc non Af Amer: >60 mL/min (ref >60)
Glucose, Bld: 138 mg/dL — ABNORMAL HIGH (ref 65–99)
Potassium: 4.1 mmol/L (ref 3.5–5.1)
SODIUM: 139 mmol/L (ref 135–145)
Total Bilirubin: 0.7 mg/dL (ref 0.3–1.2)
Total Protein: 7.3 g/dL (ref 6.5–8.1)

## 2017-12-20 LAB — URINALYSIS, ROUTINE W REFLEX MICROSCOPIC
Bilirubin Urine: NEGATIVE
Glucose, UA: NEGATIVE mg/dL
HGB URINE DIPSTICK: NEGATIVE
Ketones, ur: NEGATIVE mg/dL
Leukocytes, UA: NEGATIVE
NITRITE: NEGATIVE
Protein, ur: NEGATIVE mg/dL
SPECIFIC GRAVITY, URINE: 1.016 (ref 1.005–1.030)
pH: 5 (ref 5.0–8.0)

## 2017-12-20 LAB — CBC
HEMATOCRIT: 30.7 % — AB (ref 39.0–52.0)
HEMOGLOBIN: 9.9 g/dL — AB (ref 13.0–17.0)
MCH: 30.5 pg (ref 26.0–34.0)
MCHC: 32.2 g/dL (ref 30.0–36.0)
MCV: 94.5 fL (ref 78.0–100.0)
Platelets: 296 10*3/uL (ref 150–400)
RBC: 3.25 MIL/uL — AB (ref 4.22–5.81)
RDW: 14.9 % (ref 11.5–15.5)
WBC: 7.6 10*3/uL (ref 4.0–10.5)

## 2017-12-20 LAB — TYPE AND SCREEN
ABO/RH(D): AB POS
Antibody Screen: NEGATIVE

## 2017-12-20 LAB — PROTIME-INR
INR: 1.11
Prothrombin Time: 14.2 seconds (ref 11.4–15.2)

## 2017-12-20 LAB — SURGICAL PCR SCREEN
MRSA, PCR: NEGATIVE
STAPHYLOCOCCUS AUREUS: POSITIVE — AB

## 2017-12-20 LAB — ABO/RH: ABO/RH(D): AB POS

## 2017-12-20 LAB — GLUCOSE, CAPILLARY
GLUCOSE-CAPILLARY: 148 mg/dL — AB (ref 65–99)
Glucose-Capillary: 147 mg/dL — ABNORMAL HIGH (ref 65–99)

## 2017-12-20 LAB — APTT: aPTT: 30 seconds (ref 24–36)

## 2017-12-20 LAB — POCT ACTIVATED CLOTTING TIME: ACTIVATED CLOTTING TIME: 136 s

## 2017-12-20 SURGERY — ATHERECTOMY, ARTERY, ILIAC
Anesthesia: Monitor Anesthesia Care | Site: Leg Upper | Laterality: Right

## 2017-12-20 MED ORDER — IODIXANOL 320 MG/ML IV SOLN
INTRAVENOUS | Status: DC | PRN
  Administered 2017-12-20: 15 mL via INTRAVENOUS

## 2017-12-20 MED ORDER — HEPARIN SODIUM (PORCINE) 1000 UNIT/ML IJ SOLN
INTRAMUSCULAR | Status: AC
  Filled 2017-12-20: qty 1

## 2017-12-20 MED ORDER — SODIUM CHLORIDE 0.9 % IV SOLN
INTRAVENOUS | Status: AC
  Filled 2017-12-20: qty 1.2

## 2017-12-20 MED ORDER — COLLAGENASE 250 UNIT/GM EX OINT: 1.0000 "application " | TOPICAL_OINTMENT | Freq: Every day | CUTANEOUS | 0 refills | Status: DC

## 2017-12-20 MED ORDER — FENTANYL CITRATE (PF) 100 MCG/2ML IJ SOLN
INTRAMUSCULAR | Status: AC
  Filled 2017-12-20: qty 2

## 2017-12-20 MED ORDER — SODIUM CHLORIDE 0.9 % IV SOLN
INTRAVENOUS | Status: DC | PRN
  Administered 2017-12-20: 500 mL

## 2017-12-20 MED ORDER — SODIUM CHLORIDE 0.9 % IV SOLN
INTRAVENOUS | Status: DC
  Administered 2017-12-20: 12:00:00 via INTRAVENOUS

## 2017-12-20 MED ORDER — MUPIROCIN 2 % EX OINT
1.0000 "application " | TOPICAL_OINTMENT | Freq: Once | CUTANEOUS | Status: DC
  Filled 2017-12-20: qty 22

## 2017-12-20 MED ORDER — SODIUM CHLORIDE 0.9 % WEIGHT BASED INFUSION: 1.0000 mL/kg/h | INTRAVENOUS | Status: DC

## 2017-12-20 MED ORDER — CEFAZOLIN SODIUM-DEXTROSE 2-4 GM/100ML-% IV SOLN
2.0000 g | INTRAVENOUS | Status: AC
  Administered 2017-12-20: 2 g via INTRAVENOUS
  Filled 2017-12-20: qty 100

## 2017-12-20 MED ORDER — ONDANSETRON HCL 4 MG/2ML IJ SOLN
INTRAMUSCULAR | Status: AC
  Filled 2017-12-20: qty 2

## 2017-12-20 MED ORDER — 0.9 % SODIUM CHLORIDE (POUR BTL) OPTIME
TOPICAL | Status: DC | PRN
  Administered 2017-12-20: 1000 mL

## 2017-12-20 MED ORDER — PROTAMINE SULFATE 10 MG/ML IV SOLN
INTRAVENOUS | Status: DC | PRN
  Administered 2017-12-20: 25 mg via INTRAVENOUS
  Administered 2017-12-20: 10 mg via INTRAVENOUS
  Administered 2017-12-20: 15 mg via INTRAVENOUS

## 2017-12-20 MED ORDER — SODIUM CHLORIDE 0.9% FLUSH: 3.0000 mL | Freq: Two times a day (BID) | INTRAVENOUS | Status: DC

## 2017-12-20 MED ORDER — SODIUM CHLORIDE 0.9% FLUSH: 3.0000 mL | INTRAVENOUS | Status: DC | PRN

## 2017-12-20 MED ORDER — ACETAMINOPHEN 325 MG PO TABS: 650.0000 mg | ORAL_TABLET | ORAL | Status: DC | PRN

## 2017-12-20 MED ORDER — CHLORHEXIDINE GLUCONATE 4 % EX LIQD: 60.0000 mL | Freq: Once | CUTANEOUS | Status: DC

## 2017-12-20 MED ORDER — PROTAMINE SULFATE 10 MG/ML IV SOLN
INTRAVENOUS | Status: AC
  Filled 2017-12-20: qty 25

## 2017-12-20 MED ORDER — SODIUM CHLORIDE 0.9 % IV SOLN: 250.0000 mL | INTRAVENOUS | Status: DC | PRN

## 2017-12-20 MED ORDER — ONDANSETRON HCL 4 MG/2ML IJ SOLN: 4.0000 mg | Freq: Four times a day (QID) | INTRAMUSCULAR | Status: DC | PRN

## 2017-12-20 MED ORDER — HYDRALAZINE HCL 20 MG/ML IJ SOLN: 5.0000 mg | INTRAMUSCULAR | Status: DC | PRN

## 2017-12-20 MED ORDER — LIDOCAINE HCL (PF) 1 % IJ SOLN
INTRAMUSCULAR | Status: AC
  Filled 2017-12-20: qty 30

## 2017-12-20 MED ORDER — HEPARIN SODIUM (PORCINE) 1000 UNIT/ML IJ SOLN
INTRAMUSCULAR | Status: DC | PRN
  Administered 2017-12-20: 7500 [IU] via INTRAVENOUS

## 2017-12-20 MED ORDER — VIPERSLIDE LUBRICANT OPTIME: TOPICAL | Status: DC | PRN

## 2017-12-20 MED ORDER — FENTANYL CITRATE (PF) 100 MCG/2ML IJ SOLN
25.0000 ug | INTRAMUSCULAR | Status: DC | PRN
  Administered 2017-12-20: 25 ug via INTRAVENOUS

## 2017-12-20 MED ORDER — LIDOCAINE HCL 1 % IJ SOLN
INTRAMUSCULAR | Status: DC | PRN
  Administered 2017-12-20: 15 mL

## 2017-12-20 MED ORDER — ONDANSETRON HCL 4 MG/2ML IJ SOLN
INTRAMUSCULAR | Status: DC | PRN
  Administered 2017-12-20: 4 mg via INTRAVENOUS

## 2017-12-20 MED ORDER — LABETALOL HCL 5 MG/ML IV SOLN: 10.0000 mg | INTRAVENOUS | Status: DC | PRN

## 2017-12-20 SURGICAL SUPPLY — 75 items
BAG BANDED W/RUBBER/TAPE 36X54 (MISCELLANEOUS) ×3 IMPLANT
BAG EQP BAND 135X91 W/RBR TAPE (MISCELLANEOUS) ×1
BAG ISL DRAPE 18X18 STRL (DRAPES) ×1
BAG ISOLATION DRAPE 18X18 (DRAPES) ×1 IMPLANT
BAG SNAP BAND KOVER 36X36 (MISCELLANEOUS) ×2 IMPLANT
BANDAGE ACE 4X5 VEL STRL LF (GAUZE/BANDAGES/DRESSINGS) IMPLANT
CANISTER SUCT 3000ML PPV (MISCELLANEOUS) ×3 IMPLANT
CATH ANGIO 5F BER 65CM (CATHETERS) ×2 IMPLANT
CATH QUICKCROSS .035X135CM (MICROCATHETER) ×2 IMPLANT
CATH SOFT-VU 4F 65 STRAIGHT (CATHETERS) IMPLANT
CATH SOFT-VU STRAIGHT 4F 65CM (CATHETERS) ×3
CHLORAPREP W/TINT 26ML (MISCELLANEOUS) ×2 IMPLANT
COVER BACK TABLE 60X90IN (DRAPES) ×5 IMPLANT
COVER BACK TABLE 80X110 HD (DRAPES) ×3 IMPLANT
COVER DOME SNAP 22 D (MISCELLANEOUS) ×5 IMPLANT
COVER PROBE W GEL 5X96 (DRAPES) ×2 IMPLANT
COVER SURGICAL LIGHT HANDLE (MISCELLANEOUS) ×2 IMPLANT
COVER TRANSDUCER ULTRASND GEL (DRAPE) ×3 IMPLANT
DEVICE TORQUE KENDALL .025-038 (MISCELLANEOUS) ×2 IMPLANT
DRAPE FEMORAL ANGIO 80X135IN (DRAPES) ×5 IMPLANT
DRAPE ISOLATION BAG 18X18 (DRAPES) ×2
DRSG TEGADERM 4X4.75 (GAUZE/BANDAGES/DRESSINGS) ×2 IMPLANT
ELECT REM PT RETURN 9FT ADLT (ELECTROSURGICAL) ×3
ELECTRODE REM PT RTRN 9FT ADLT (ELECTROSURGICAL) ×1 IMPLANT
GAUZE SPONGE 2X2 8PLY STRL LF (GAUZE/BANDAGES/DRESSINGS) IMPLANT
GAUZE SPONGE 4X4 16PLY XRAY LF (GAUZE/BANDAGES/DRESSINGS) ×5 IMPLANT
GLOVE BIO SURGEON STRL SZ7 (GLOVE) ×5 IMPLANT
GLOVE BIOGEL PI IND STRL 6.5 (GLOVE) IMPLANT
GLOVE BIOGEL PI IND STRL 7.0 (GLOVE) IMPLANT
GLOVE BIOGEL PI IND STRL 7.5 (GLOVE) ×1 IMPLANT
GLOVE BIOGEL PI INDICATOR 6.5 (GLOVE) ×2
GLOVE BIOGEL PI INDICATOR 7.0 (GLOVE) ×2
GLOVE BIOGEL PI INDICATOR 7.5 (GLOVE) ×2
GLOVE SURG SS PI 7.0 STRL IVOR (GLOVE) ×2 IMPLANT
GOWN STRL REUS W/ TWL LRG LVL3 (GOWN DISPOSABLE) ×3 IMPLANT
GOWN STRL REUS W/TWL LRG LVL3 (GOWN DISPOSABLE) ×9
GUIDEWIRE ANGLED .035X260CM (WIRE) ×2 IMPLANT
KIT BASIN OR (CUSTOM PROCEDURE TRAY) ×3 IMPLANT
KIT TURNOVER KIT B (KITS) ×3 IMPLANT
LUBRICANT VIPERSLIDE CORONARY (MISCELLANEOUS) ×2 IMPLANT
NDL 18GX1X1/2 (RX/OR ONLY) (NEEDLE) IMPLANT
NDL HYPO 25GX1X1/2 BEV (NEEDLE) IMPLANT
NDL PERC 18GX7CM (NEEDLE) ×1 IMPLANT
NEEDLE 18GX1X1/2 (RX/OR ONLY) (NEEDLE) ×3 IMPLANT
NEEDLE HYPO 25GX1X1/2 BEV (NEEDLE) ×3 IMPLANT
NEEDLE PERC 18GX7CM (NEEDLE) ×3 IMPLANT
NS IRRIG 1000ML POUR BTL (IV SOLUTION) ×6 IMPLANT
PACK SURGICAL SETUP 50X90 (CUSTOM PROCEDURE TRAY) ×3 IMPLANT
PAD ARMBOARD 7.5X6 YLW CONV (MISCELLANEOUS) ×6 IMPLANT
PROTECTION STATION PRESSURIZED (MISCELLANEOUS) ×3
SET MICROPUNCTURE 5F STIFF (MISCELLANEOUS) ×2 IMPLANT
SHEATH AVANTI 11CM 5FR (SHEATH) ×2 IMPLANT
SHEATH BRITE TIP 6FR 35CM (SHEATH) ×2 IMPLANT
SPONGE GAUZE 2X2 STER 10/PKG (GAUZE/BANDAGES/DRESSINGS) ×2
SPONGE LAP 18X18 5 PK (GAUZE/BANDAGES/DRESSINGS) ×4 IMPLANT
STATION PROTECTION PRESSURIZED (MISCELLANEOUS) IMPLANT
SUT MNCRL AB 4-0 PS2 18 (SUTURE) IMPLANT
SYR 10ML LL (SYRINGE) ×9 IMPLANT
SYR 20CC LL (SYRINGE) ×3 IMPLANT
SYR 30ML LL (SYRINGE) ×5 IMPLANT
SYR 3ML LL SCALE MARK (SYRINGE) ×11 IMPLANT
SYR CONTROL 10ML LL (SYRINGE) ×2 IMPLANT
TAPE VIPERTRACK RADIOPAQ 30X (MISCELLANEOUS) IMPLANT
TAPE VIPERTRACK RADIOPAQUE (MISCELLANEOUS) ×3
TOWEL GREEN STERILE (TOWEL DISPOSABLE) ×6 IMPLANT
TOWEL GREEN STERILE FF (TOWEL DISPOSABLE) ×3 IMPLANT
TRAY FOLEY MTR SLVR 16FR STAT (CATHETERS) IMPLANT
UNDERPAD 30X30 (UNDERPADS AND DIAPERS) ×3 IMPLANT
WATER STERILE IRR 1000ML POUR (IV SOLUTION) ×3 IMPLANT
WIRE AMPLATZ SS-J .035X260CM (WIRE) ×2 IMPLANT
WIRE BENTSON .035X145CM (WIRE) ×5 IMPLANT
WIRE G V18X300CM (WIRE) ×4 IMPLANT
WIRE MICRO SET SILHO 5FR 7 (SHEATH) IMPLANT
WIRE ROSEN-J .035X260CM (WIRE) ×2 IMPLANT
WIRE TORQFLEX AUST .018X40CM (WIRE) ×6 IMPLANT

## 2017-12-20 NOTE — Discharge Instructions (Signed)

## 2017-12-20 NOTE — Interval H&P Note (Signed)
History and Physical Interval Note:  12/20/2017 10:56 AM  Vincent Siren Sr.  has presented today for surgery, with the diagnosis of PERIPHEAL ARTERY DISEASE  The various methods of treatment have been discussed with the patient and family. After consideration of risks, benefits and other options for treatment, the patient has consented to  Procedure(s): RIGHT ANTEGRADE CANNULATION OF RIGHT SUPERFICIAL FEMORAL ARTERY AND POPLITEAL ORBITAL ATHERECTOMY ANGIOPLASTY (Right) as a surgical intervention .  The patient's history has been reviewed, patient examined, no change in status, stable for surgery.  I have reviewed the patient's chart and labs.  Questions were answered to the patient's satisfaction.     Adele Barthel

## 2017-12-20 NOTE — Transfer of Care (Signed)
Immediate Anesthesia Transfer of Care Note  Patient: Vincent VANDUYNE Sr.  Procedure(s) Performed: RIGHT ANTEGRADE FAILED CANNULATION OF RIGHT SUPERFICIAL FEMORAL ARTERY. (Right Leg Upper)  Patient Location: PACU  Anesthesia Type:MAC  Level of Consciousness: awake, alert  and oriented  Airway & Oxygen Therapy: Patient Spontanous Breathing  Post-op Assessment: Report given to RN  Post vital signs: Reviewed and stable  Last Vitals:  Vitals Value Taken Time  BP    Temp    Pulse 49 12/20/2017  2:04 PM  Resp 13 12/20/2017  2:04 PM  SpO2 89 % 12/20/2017  2:04 PM  Vitals shown include unvalidated device data.  Last Pain:  Vitals:   12/20/17 1131  TempSrc:   PainSc: 0-No pain      Patients Stated Pain Goal: 3 (40/98/11 9147)  Complications: No apparent anesthesia complications

## 2017-12-20 NOTE — Op Note (Signed)
OPERATIVE NOTE   PROCEDURE: 1. Antegrade cannulation of right common femoral artery  2. Left leg runoff 3. Failed recannulation of right superficial femoral artery   PRE-OPERATIVE DIAGNOSIS: right great toe ulcer  POST-OPERATIVE DIAGNOSIS: same as above   SURGEON: Adele Barthel, MD  ANESTHESIA: local and MAC  ESTIMATED BLOOD LOSS: 50 cc  FINDING(S): 1.  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  Distal 1/3 sub-total occlusion 3.  Preservation of profunda femoral artery collaterals to above-the-knee popliteal artery   SPECIMEN(S):  none  INDICATIONS:   Vincent Savage. is a 84 y.o. male who presents with right great toe ulcer.  Angiography demonstrated multiple sub-total occlusions in the right superficial femoral artery.  I recommended: antegrade cannulation of common femoral artery and possible intervention on right femoropopliteal artery.  I discussed with the patient the nature of angiographic procedures, especially the limited patencies of any endovascular intervention.  The patient is aware of that the risks of an angiographic procedure include but are not limited to: bleeding, infection, access site complications, renal failure, embolization, rupture of vessel, dissection, arteriovenous fistula, possible need for emergent surgical intervention, possible need for surgical procedures to treat the patient's pathology, anaphylactic reaction to contrast, and stroke and death.  The patient is aware of the risks and agrees to proceed.   DESCRIPTION: After obtaining full informed written consent, the patient was brought back to the operating room and placed supine upon the operating table.  The patient received IV antibiotics prior to induction.  A procedure time out was completed and the correct surgical site was verified.  After obtaining adequate anesthesia, the patient was prepped and  draped in the standard fashion for: antegrade cannulation of right common femoral artery.  I injected a total of 10 cc of 1% lidocaine without epinephrine over the common femoral artery cannulation site and made a stab incision.  I  cannulated the right common femoral artery in an antegrade fashion with a micropuncture needle under Sonosite guidance.  I passed a microwire into the common femoral artery.  The microsheath was loaded over the wire into the common femoral artery.  The microwire was exchanged for a Bentson wire which was lodged in the superficial femoral artery.  Unfortunately, the wire was too soft to support exchange for a 6-Fr sheath.  I pulled out the wire and held pressure to the artery for 3 minutes.   I then repeated the cannulation process and placement of microsheath into the right common femoral artery.  This time I loaded an Amplatz into the superficial femoral artery.  The arterial tract was dilated with a 5-Fr sheath and dilator and then a 6-Fr long sheath was loaded into the superficial femoral artery.  I removed the Amplatz wire.  The patient was given 7500 units of Heparin intravenously, which was a therapeutic bolus.  I did a hand injection which demonstrated two sub-total occlusion in the superficial femoral artery.  I loaded a straight catheter with a Glidewire into the right sheath.  Using this combination, I got to the first sub-total occlusion.  Despite multiple manipulations I could not cross the first lesion.  I did a hand injection which demonstrates in retrospect a chronic total occlusion that was heavily calcified and an overlying collateral that masquerades as a small flow lumen.  I tried first a V-18 wire with a BER-2 catheter.  This could not navigate through  the occlusion.  A 12 Newton dissection wire was then loaded and this would not cross either.  I then tried sub-intimal techniques with the BER-2 and glidewire.  Unfortunately, the plaque was so calcified that I could  not even start a subintimal dissection.   I did a completion injection which demonstrated persistence of above-the-knee popliteal artery reconstitution via the profunda femoral artery collaterals.  The sheath was aspirated.  No clots were present and the sheath was reloaded with heparinized saline.  I gave the patient 50 mg of Protamine to reverse anticoagulation.  An ACT was obtained after a few minutes after the Protamine administration: <150 sec was measured.  The sheath was removed and pressure held for 20 minutes.  Given the small distal target, I doubt retrograde access is possible.  I also suspect the calcific obstruction will again complicate recannulation.  Patient is not a surgical candidate given his extreme age, so we will have to continue wound care at this point.   COMPLICATIONS: none  CONDITION: stable   Adele Barthel, MD, [facility] [place] Office: [phone] Pager: [phone]  12/20/2017, 1:57 PM

## 2017-12-20 NOTE — Anesthesia Procedure Notes (Signed)
Procedure Name: MAC Date/Time: 12/20/2017 11:57 AM Performed by: Barrington Ellison, CRNA Pre-anesthesia Checklist: Patient identified, Emergency Drugs available, Suction available, Patient being monitored and Timeout performed Patient Re-evaluated:Patient Re-evaluated prior to induction Oxygen Delivery Method: Nasal cannula

## 2017-12-20 NOTE — Anesthesia Preprocedure Evaluation (Signed)
Anesthesia Evaluation  Patient identified by MRN, date of birth, ID band Patient awake    Reviewed: Allergy & Precautions, NPO status , Patient's Chart, lab work & pertinent test results  Airway Mallampati: II  TM Distance: >3 FB Neck ROM: Full    Dental  (+) Dental Advisory Given   Pulmonary    breath sounds clear to auscultation       Cardiovascular hypertension, Pt. on medications + CAD, + Peripheral Vascular Disease and +CHF  + Valvular Problems/Murmurs AS  Rhythm:Regular + Systolic murmurs    Neuro/Psych PSYCHIATRIC DISORDERS Anxiety Depression  Neuromuscular disease    GI/Hepatic hiatal hernia, GERD  Medicated and Controlled,  Endo/Other  diabetes  Renal/GU CRFRenal disease     Musculoskeletal  (+) Arthritis ,   Abdominal   Peds  Hematology  (+) anemia ,   Anesthesia Other Findings  Pt is a 84 year old male scheduled for R antegrade cannulation of R superficial femoral artery and popliteal orbital atherectomy angioplasty on 12/20/2017 with Adele Barthel, MD  Pt reports Dr. Terrence Dupont told him "he cannot be put to sleep" for this surgery.  I spoke with Dr. Terrence Dupont who is uncomfortable with pt having surgery of any kind given his advanced age and illnesses.   - PCP is Dione Housekeeper, MD (notes in care everywhere)  - Cardiologist is Charolette Forward, MD.   PMH includes:  CAD (mild by 2013 cath), CHF, aortic stenosis, HTN, DM, anemia, prostate cancer, GERD. Never smoker. BMI 26  Medications include: Amlodipine, ASA 81 mg, losartan-HCTZ, Prilosec  Labs will be obtained day of surgery  EKG 12/06/17: NSR. LAD. RBBB.   Echo 08/31/16 (Dr. Rhae Lerner office): 1.  Normal LV size.  LV systolic function normal with normal EF 50-55%.  LV shows normal contractility in size.  LVH seen with abnormal diastolic function noted. 2.  Mild mitral regurgitation. 3.  Severe aortic stenosis is present.  Severe calcification of AV  leaflets.  Valve valve area approximately 0.87 cm.  Normal peak gradient 67 mmHg.  Mean gradient 42 mmHg.  Mild aortic regurgitation seen. 4.  Trace pulmonic regurgitation. 5.  Mild tricuspid regurgitation.   6.  No intracardiac shunting found. 7.  No intracardiac masses or thrombi. 8.  No pericardial effusion is seen  Cardiac cath 07/23/12:  - Left main has 25-30% distal stenosis.  - LAD has 20-30% proximal stenosis. D1 has 40-50% ostial napkin ring stenosis. D2 is patent.  - Ramus is very very small, which is patent.  - Left CX has 15-20% proximal and mid-stenosis. OM 1 and OM 2 were very small. OM 3 was moderate size, which was patent.  - RCA has 15-20% proximal and mid-stenosis. PDA is small, which is patent. PLV branches are very small, which are patent.    Reproductive/Obstetrics                             Anesthesia Physical Anesthesia Plan  ASA: IV  Anesthesia Plan: MAC   Post-op Pain Management:    Induction:   PONV Risk Score and Plan: 1 and Ondansetron  Airway Management Planned: Nasal Cannula  Additional Equipment:   Intra-op Plan:   Post-operative Plan:   Informed Consent: I have reviewed the patients History and Physical, chart, labs and discussed the procedure including the risks, benefits and alternatives for the proposed anesthesia with the patient or authorized representative who has indicated his/her understanding and acceptance.   Dental  advisory given  Plan Discussed with: CRNA and Surgeon  Anesthesia Plan Comments:         Anesthesia Quick Evaluation

## 2017-12-21 ENCOUNTER — Encounter (HOSPITAL_COMMUNITY): Payer: Self-pay | Admitting: Vascular Surgery

## 2017-12-21 ENCOUNTER — Other Ambulatory Visit: Payer: Self-pay | Admitting: Vascular Surgery

## 2017-12-21 DIAGNOSIS — I7025 Atherosclerosis of native arteries of other extremities with ulceration: Secondary | ICD-10-CM

## 2017-12-26 ENCOUNTER — Telehealth: Payer: Self-pay | Admitting: Vascular Surgery

## 2017-12-26 NOTE — Telephone Encounter (Signed)
-----   Message from Mena Goes, RN sent at 12/21/2017  9:31 AM EDT ----- Regarding: 4 weeks wound check   ----- Message ----- From: Conrad Roberts, MD Sent: 12/20/2017   2:35 PM To: [address]  Vincent STILLE Sr. [id number] 29-Feb-1918  Will need to set up home wound care for patient.  1.  Wash off right foot with soap and water.  Dry right foot. 2.  Santyl ointment to right great toe ulcer daily. 3.  Apply wet-to-dry dressing on top right great toe ulcer daily.  Follow up in 4 weeks for wound check.

## 2017-12-26 NOTE — Telephone Encounter (Signed)
Spoke to pt for appt wound check 5/8  Mld lttr

## 2017-12-27 NOTE — Anesthesia Postprocedure Evaluation (Signed)
Anesthesia Post Note  Patient: Vincent ALPER Sr.  Procedure(s) Performed: RIGHT ANTEGRADE FAILED CANNULATION OF RIGHT SUPERFICIAL FEMORAL ARTERY. (Right Leg Upper)     Patient location during evaluation: PACU Anesthesia Type: MAC Level of consciousness: awake and alert Pain management: pain level controlled Vital Signs Assessment: post-procedure vital signs reviewed and stable Respiratory status: spontaneous breathing, nonlabored ventilation, respiratory function stable and patient connected to nasal cannula oxygen Cardiovascular status: blood pressure returned to baseline and stable Postop Assessment: no apparent nausea or vomiting Anesthetic complications: no    Last Vitals:  Vitals:   12/20/17 1642 12/20/17 1704  BP:  (!) 135/54  Pulse: 71 73  Resp: 15 16  Temp:    SpO2: 96% 92%    Last Pain:  Vitals:   12/20/17 1704  TempSrc:   PainSc: 0-No pain                 Solly Derasmo

## 2018-01-29 NOTE — Progress Notes (Signed)
Established Critical Limb Ischemia Patient   History of Present Illness   Vincent Savage. is a 84 y.o. (12-21-1917) male who presents with chief complaint: right great toe ulcer from abrasion. Pt's angiogram on 12/06/17 demonstrated: femoropopliteal stenoses and occlusion of all tibial arteries.  I tried an antegrade approach to the RLE on 12/20/17 but there turned out to be chronic total occlusion of the proximal 1/3 of the SFA that could not be crossed.  The patient has no rest pain and wounds include: R great toe ulcer which is being treated with Santyl and wet to dry dressings.  The patient notes symptoms have no progressed.  The patient's treatment regimen currently included: maximal medical management and wound care.  The patient's PMH, PSH, SH, and FamHx were reviewed on 01/30/18 are unchanged from 12/20/17.  Current Outpatient Medications  Medication Sig Dispense Refill  . acetaminophen (TYLENOL ARTHRITIS PAIN) 650 MG CR tablet Take 650 mg by mouth 2 (two) times daily.     Marland Kitchen amLODipine (NORVASC) 10 MG tablet Take 10 mg by mouth daily.     Marland Kitchen aspirin 81 MG tablet Take 81 mg by mouth daily.     . cadexomer iodine (IODOSORB) 0.9 % gel Apply 1 application topically every 3 (three) days.    . Cholecalciferol (VITAMIN D3 PO) Take 1 capsule by mouth daily.    . collagenase (SANTYL) ointment Apply 1 application topically daily. Apply to Right great toe ulcer daily. Apply wet-to-dry dressing on top Santyl. 15 g 0  . cyanocobalamin (,VITAMIN B-12,) 1000 MCG/ML injection Patient wishes to have b12 injection done at Dr [name] office I have advised him to call and set these up since I do not know the office policy.  Inject one ML IM once a week for three weeks, then inject one ML Im once a month for one year. Then Patient needs repeat labs done to recheck his b12 level. (Patient taking differently: Inject 1,000 mcg into the muscle every 30 (thirty) days. ) 1 mL 0  . escitalopram (LEXAPRO)  10 MG tablet Take 10 mg by mouth at bedtime.     . fluticasone (FLONASE) 50 MCG/ACT nasal spray Place 2 sprays into both nostrils daily as needed for allergies or rhinitis.    Marland Kitchen latanoprost (XALATAN) 0.005 % ophthalmic solution Place 1 drop into both eyes at bedtime.    Marland Kitchen losartan-hydrochlorothiazide (HYZAAR) 100-25 MG per tablet Take 1 tablet by mouth daily.    Marland Kitchen omeprazole (PRILOSEC) 20 MG capsule Take 20 mg by mouth daily.     No current facility-administered medications for this visit.     On ROS today: no fever or chills, no drainage from R great toe   Physical Examination   Vitals:   01/30/18 1643  BP: (!) 123/54  Pulse: 81  Resp: 18  Temp: (!) 97.2 F (36.2 C)  TempSrc: Oral  SpO2: 94%  Weight: 164 lb (74.4 kg)  Height: 5\' 7"  (1.702 m)   Body mass index is 25.69 kg/m.  General Alert, O x 3, WD, Elderly  Pulmonary Sym exp, good B air movt, CTA B  Cardiac RRR, Nl S1, S2, no Murmurs, No rubs, No S3,S4  Vascular Vessel Right Left  Radial Palpable Palpable  Brachial Palpable Palpable  Carotid Palpable, No Bruit Palpable, No Bruit  Aorta Not palpable N/A  Femoral Palpable Palpable  Popliteal Not palpable Not palpable  PT Not palpable Not palpable  DP Not palpable Not palpable  Musculo- skeletal M/S 5/5 throughout  , Extremities without ischemic changes except shallow ulcer R great toe, Ulcer is clean without drain: fibrin debris mechanically debrided, pink granulation present in 50% of ulcer, No edema present, No visible varicosities , No Lipodermatosclerosis present  Neurologic Pain and light touch intact in extremities except for decreased sensation in R, Motor exam as listed above    Medical Decision Making   [name]. is a 84 y.o. male who presents with: R great toe non-healing ulcer, RLE critical limb ischemia, BLE moderate PAD   In this 84 year old patient, I would continue with wound care to the R great toe: Santyl with wet-to-dry dressing  daily.  His extreme age will limit the options available to him.  Fortunately, the ulcer appears to be cleaning up.  Hopefully granulation tissue will be adequate supports some re-epithelization and wound contracture.  Will check on the wound again in 4 Weeks.  I discussed in depth with the patient the nature of atherosclerosis, and emphasized the importance of maximal medical management including strict control of blood pressure, blood glucose, and lipid levels, antiplatelet agents, obtaining regular exercise, and cessation of smoking.    The patient is aware that without maximal medical management the underlying atherosclerotic disease process will progress, limiting the benefit of any interventions.  The patient is currently not on on statin as not medically indicated.   The patient is currently on an anti-platelet: ASA.  Thank you for allowing [place] to participate in this patient's care.   Adele Barthel, MD, FACS [facility] [place] Office: [phone] Pager: [phone]

## 2018-01-30 ENCOUNTER — Ambulatory Visit (INDEPENDENT_AMBULATORY_CARE_PROVIDER_SITE_OTHER): Payer: Medicare Other | Admitting: Vascular Surgery

## 2018-01-30 ENCOUNTER — Encounter: Payer: Self-pay | Admitting: Vascular Surgery

## 2018-01-30 VITALS — BP 123/54 | HR 81 | Temp 97.2°F | Resp 18 | Ht 67.0 in | Wt 164.0 lb

## 2018-01-30 DIAGNOSIS — I7025 Atherosclerosis of native arteries of other extremities with ulceration: Secondary | ICD-10-CM

## 2018-02-27 ENCOUNTER — Ambulatory Visit: Payer: Medicare Other | Admitting: Vascular Surgery

## 2018-03-06 ENCOUNTER — Ambulatory Visit: Payer: Medicare Other | Admitting: Vascular Surgery

## 2018-04-13 ENCOUNTER — Encounter (HOSPITAL_COMMUNITY): Payer: Self-pay

## 2018-04-13 ENCOUNTER — Other Ambulatory Visit: Payer: Self-pay

## 2018-04-13 ENCOUNTER — Encounter (HOSPITAL_COMMUNITY)
Admission: EM | Disposition: A | Discharge status: Home / Self Care | Payer: Self-pay | Source: Home / Self Care | Attending: Internal Medicine

## 2018-04-13 ENCOUNTER — Inpatient Hospital Stay (HOSPITAL_COMMUNITY)
Admission: EM | Admit: 2018-04-13 | Discharge: 2018-04-16 | DRG: 247 | Disposition: A | Discharge status: Home / Self Care | Payer: Medicare Other | Attending: Internal Medicine | Admitting: Internal Medicine

## 2018-04-13 DIAGNOSIS — Z833 Family history of diabetes mellitus: Secondary | ICD-10-CM

## 2018-04-13 DIAGNOSIS — Z79899 Other long term (current) drug therapy: Secondary | ICD-10-CM | POA: Diagnosis not present

## 2018-04-13 DIAGNOSIS — E1151 Type 2 diabetes mellitus with diabetic peripheral angiopathy without gangrene: Secondary | ICD-10-CM | POA: Diagnosis present

## 2018-04-13 DIAGNOSIS — Z7951 Long term (current) use of inhaled steroids: Secondary | ICD-10-CM | POA: Diagnosis not present

## 2018-04-13 DIAGNOSIS — Z8042 Family history of malignant neoplasm of prostate: Secondary | ICD-10-CM | POA: Diagnosis not present

## 2018-04-13 DIAGNOSIS — I10 Essential (primary) hypertension: Secondary | ICD-10-CM | POA: Diagnosis present

## 2018-04-13 DIAGNOSIS — E11621 Type 2 diabetes mellitus with foot ulcer: Secondary | ICD-10-CM | POA: Diagnosis present

## 2018-04-13 DIAGNOSIS — Z8546 Personal history of malignant neoplasm of prostate: Secondary | ICD-10-CM

## 2018-04-13 DIAGNOSIS — Z7982 Long term (current) use of aspirin: Secondary | ICD-10-CM

## 2018-04-13 DIAGNOSIS — I213 ST elevation (STEMI) myocardial infarction of unspecified site: Secondary | ICD-10-CM

## 2018-04-13 DIAGNOSIS — I2510 Atherosclerotic heart disease of native coronary artery without angina pectoris: Secondary | ICD-10-CM | POA: Diagnosis present

## 2018-04-13 DIAGNOSIS — I350 Nonrheumatic aortic (valve) stenosis: Secondary | ICD-10-CM | POA: Diagnosis present

## 2018-04-13 DIAGNOSIS — Z955 Presence of coronary angioplasty implant and graft: Secondary | ICD-10-CM

## 2018-04-13 DIAGNOSIS — E785 Hyperlipidemia, unspecified: Secondary | ICD-10-CM | POA: Diagnosis present

## 2018-04-13 DIAGNOSIS — Z8249 Family history of ischemic heart disease and other diseases of the circulatory system: Secondary | ICD-10-CM | POA: Diagnosis not present

## 2018-04-13 DIAGNOSIS — I959 Hypotension, unspecified: Secondary | ICD-10-CM

## 2018-04-13 DIAGNOSIS — I2111 ST elevation (STEMI) myocardial infarction involving right coronary artery: Principal | ICD-10-CM | POA: Diagnosis present

## 2018-04-13 DIAGNOSIS — I249 Acute ischemic heart disease, unspecified: Secondary | ICD-10-CM | POA: Diagnosis present

## 2018-04-13 DIAGNOSIS — L97519 Non-pressure chronic ulcer of other part of right foot with unspecified severity: Secondary | ICD-10-CM | POA: Diagnosis present

## 2018-04-13 DIAGNOSIS — K219 Gastro-esophageal reflux disease without esophagitis: Secondary | ICD-10-CM | POA: Diagnosis present

## 2018-04-13 HISTORY — PX: CORONARY/GRAFT ANGIOGRAPHY: CATH118237

## 2018-04-13 HISTORY — PX: CORONARY/GRAFT ACUTE MI REVASCULARIZATION: CATH118305

## 2018-04-13 LAB — CBC WITH DIFFERENTIAL/PLATELET
ABS IMMATURE GRANULOCYTES: 0.1 10*3/uL (ref 0.0–0.1)
ABS IMMATURE GRANULOCYTES: 0.1 10*3/uL (ref 0.0–0.1)
BASOS ABS: 0 10*3/uL (ref 0.0–0.1)
BASOS PCT: 0 %
Basophils Absolute: 0 10*3/uL (ref 0.0–0.1)
Basophils Relative: 0 %
Eosinophils Absolute: 0 10*3/uL (ref 0.0–0.7)
Eosinophils Absolute: 0.5 10*3/uL (ref 0.0–0.7)
Eosinophils Relative: 0 %
Eosinophils Relative: 4 %
HCT: 30.3 % — ABNORMAL LOW (ref 39.0–52.0)
HCT: 31.5 % — ABNORMAL LOW (ref 39.0–52.0)
Hemoglobin: 10 g/dL — ABNORMAL LOW (ref 13.0–17.0)
Hemoglobin: 10 g/dL — ABNORMAL LOW (ref 13.0–17.0)
Immature Granulocytes: 0 %
Immature Granulocytes: 1 %
Lymphocytes Relative: 26 %
Lymphocytes Relative: 7 %
Lymphs Abs: 0.9 10*3/uL (ref 0.7–4.0)
Lymphs Abs: 3 10*3/uL (ref 0.7–4.0)
MCH: 30.3 pg (ref 26.0–34.0)
MCH: 30.4 pg (ref 26.0–34.0)
MCHC: 31.7 g/dL (ref 30.0–36.0)
MCHC: 33 g/dL (ref 30.0–36.0)
MCV: 92.1 fL (ref 78.0–100.0)
MCV: 95.5 fL (ref 78.0–100.0)
MONO ABS: 0.5 10*3/uL (ref 0.1–1.0)
Monocytes Absolute: 0.7 10*3/uL (ref 0.1–1.0)
Monocytes Relative: 4 %
Monocytes Relative: 6 %
NEUTROS ABS: 7.5 10*3/uL (ref 1.7–7.7)
NEUTROS ABS: 11.8 10*3/uL — AB (ref 1.7–7.7)
NEUTROS PCT: 88 %
Neutrophils Relative %: 64 %
PLATELETS: 273 10*3/uL (ref 150–400)
PLATELETS: 239 10*3/uL (ref 150–400)
RBC: 3.29 MIL/uL — ABNORMAL LOW (ref 4.22–5.81)
RBC: 3.3 MIL/uL — AB (ref 4.22–5.81)
RDW: 13.6 % (ref 11.5–15.5)
RDW: 13.8 % (ref 11.5–15.5)
WBC: 11.7 10*3/uL — AB (ref 4.0–10.5)
WBC: 13.3 10*3/uL — ABNORMAL HIGH (ref 4.0–10.5)

## 2018-04-13 LAB — LIPID PANEL
CHOL/HDL RATIO: 6.1 ratio
Cholesterol: 141 mg/dL (ref 0–200)
HDL: 23 mg/dL — AB (ref >40)
LDL Cholesterol: 86 mg/dL (ref 0–99)
Triglycerides: 161 mg/dL — ABNORMAL HIGH (ref <150)
VLDL: 32 mg/dL (ref 0–40)

## 2018-04-13 LAB — COMPREHENSIVE METABOLIC PANEL
ALBUMIN: 3.4 g/dL — AB (ref 3.5–5.0)
ALT: 21 U/L (ref 0–44)
ANION GAP: 15 (ref 5–15)
AST: 27 U/L (ref 15–41)
Alkaline Phosphatase: 41 U/L (ref 38–126)
BILIRUBIN TOTAL: 0.8 mg/dL (ref 0.3–1.2)
BUN: 27 mg/dL — AB (ref 8–23)
CHLORIDE: 105 mmol/L (ref 98–111)
CO2: 17 mmol/L — AB (ref 22–32)
CREATININE: 1.18 mg/dL (ref 0.61–1.24)
Calcium: 8.4 mg/dL — ABNORMAL LOW (ref 8.9–10.3)
GFR calc non Af Amer: 49 mL/min — ABNORMAL LOW (ref >60)
GFR, EST AFRICAN AMERICAN: 57 mL/min — AB (ref >60)
GLUCOSE: 330 mg/dL — AB (ref 70–99)
Potassium: 3.6 mmol/L (ref 3.5–5.1)
SODIUM: 137 mmol/L (ref 135–145)
Total Protein: 6.3 g/dL — ABNORMAL LOW (ref 6.5–8.1)

## 2018-04-13 LAB — PROTIME-INR
INR: 1.16
PROTHROMBIN TIME: 14.7 s (ref 11.4–15.2)

## 2018-04-13 LAB — TROPONIN I

## 2018-04-13 LAB — APTT: aPTT: 27 seconds (ref 24–36)

## 2018-04-13 SURGERY — CORONARY/GRAFT ACUTE MI REVASCULARIZATION
Anesthesia: LOCAL

## 2018-04-13 MED ORDER — HYDRALAZINE HCL 20 MG/ML IJ SOLN: 5.0000 mg | INTRAMUSCULAR | Status: AC | PRN

## 2018-04-13 MED ORDER — VERAPAMIL HCL 2.5 MG/ML IV SOLN
INTRAVENOUS | Status: DC | PRN
  Administered 2018-04-13: 10 mL via INTRA_ARTERIAL

## 2018-04-13 MED ORDER — HEPARIN SODIUM (PORCINE) 1000 UNIT/ML IJ SOLN
INTRAMUSCULAR | Status: AC
  Filled 2018-04-13: qty 1

## 2018-04-13 MED ORDER — SODIUM CHLORIDE 0.9 % IV SOLN: INTRAVENOUS | Status: DC

## 2018-04-13 MED ORDER — ASPIRIN 300 MG RE SUPP: 300.0000 mg | RECTAL | Status: DC

## 2018-04-13 MED ORDER — NITROGLYCERIN 0.4 MG SL SUBL: 0.4000 mg | SUBLINGUAL_TABLET | SUBLINGUAL | Status: DC | PRN

## 2018-04-13 MED ORDER — IOPAMIDOL (ISOVUE-370) INJECTION 76%
INTRAVENOUS | Status: AC
  Filled 2018-04-13: qty 125

## 2018-04-13 MED ORDER — HEPARIN (PORCINE) IN NACL 1000-0.9 UT/500ML-% IV SOLN
INTRAVENOUS | Status: DC | PRN
  Administered 2018-04-13 (×2): 500 mL

## 2018-04-13 MED ORDER — TICAGRELOR 90 MG PO TABS
ORAL_TABLET | ORAL | Status: DC | PRN
  Administered 2018-04-13: 180 mg via ORAL

## 2018-04-13 MED ORDER — HEPARIN SODIUM (PORCINE) 5000 UNIT/ML IJ SOLN
5000.0000 [IU] | Freq: Three times a day (TID) | INTRAMUSCULAR | Status: DC
  Administered 2018-04-14 – 2018-04-16 (×7): 5000 [IU] via SUBCUTANEOUS
  Filled 2018-04-13 (×7): qty 1

## 2018-04-13 MED ORDER — SODIUM CHLORIDE 0.9 % IV SOLN: 4.0000 ug/kg/min | INTRAVENOUS | Status: AC

## 2018-04-13 MED ORDER — LABETALOL HCL 5 MG/ML IV SOLN: 10.0000 mg | INTRAVENOUS | Status: AC | PRN

## 2018-04-13 MED ORDER — SODIUM CHLORIDE 0.9 % IV SOLN
INTRAVENOUS | Status: AC | PRN
  Administered 2018-04-13: 4 ug/kg/min via INTRAVENOUS

## 2018-04-13 MED ORDER — ASPIRIN EC 81 MG PO TBEC
81.0000 mg | DELAYED_RELEASE_TABLET | Freq: Every day | ORAL | Status: DC
  Administered 2018-04-14 – 2018-04-16 (×3): 81 mg via ORAL
  Filled 2018-04-13 (×3): qty 1

## 2018-04-13 MED ORDER — SODIUM CHLORIDE 0.9 % IV SOLN
INTRAVENOUS | Status: AC | PRN
  Administered 2018-04-13: 74 mL/h via INTRAVENOUS

## 2018-04-13 MED ORDER — SODIUM CHLORIDE 0.9% FLUSH: 3.0000 mL | INTRAVENOUS | Status: DC | PRN

## 2018-04-13 MED ORDER — ONDANSETRON HCL 4 MG/2ML IJ SOLN: 4.0000 mg | Freq: Four times a day (QID) | INTRAMUSCULAR | Status: DC | PRN

## 2018-04-13 MED ORDER — HYDRALAZINE HCL 20 MG/ML IJ SOLN: 5.0000 mg | INTRAMUSCULAR | Status: DC | PRN

## 2018-04-13 MED ORDER — HEPARIN SODIUM (PORCINE) 5000 UNIT/ML IJ SOLN: 60.0000 [IU]/kg | Freq: Once | INTRAMUSCULAR | Status: DC

## 2018-04-13 MED ORDER — TICAGRELOR 90 MG PO TABS
ORAL_TABLET | ORAL | Status: AC
  Filled 2018-04-13: qty 2

## 2018-04-13 MED ORDER — HEPARIN SODIUM (PORCINE) 5000 UNIT/ML IJ SOLN
4000.0000 [IU] | Freq: Once | INTRAMUSCULAR | Status: DC
  Administered 2018-04-13: 20:00:00 via INTRAVENOUS

## 2018-04-13 MED ORDER — VERAPAMIL HCL 2.5 MG/ML IV SOLN
INTRAVENOUS | Status: AC
  Filled 2018-04-13: qty 2

## 2018-04-13 MED ORDER — CANGRELOR TETRASODIUM 50 MG IV SOLR
INTRAVENOUS | Status: AC
  Filled 2018-04-13: qty 50

## 2018-04-13 MED ORDER — HEPARIN SODIUM (PORCINE) 5000 UNIT/ML IJ SOLN
INTRAMUSCULAR | Status: AC
  Filled 2018-04-13: qty 1

## 2018-04-13 MED ORDER — SODIUM CHLORIDE 0.9% FLUSH
3.0000 mL | Freq: Two times a day (BID) | INTRAVENOUS | Status: DC
  Administered 2018-04-14 – 2018-04-16 (×5): 3 mL via INTRAVENOUS

## 2018-04-13 MED ORDER — SODIUM CHLORIDE 0.9 % IV SOLN: INTRAVENOUS | Status: AC

## 2018-04-13 MED ORDER — HEPARIN (PORCINE) IN NACL 1000-0.9 UT/500ML-% IV SOLN
INTRAVENOUS | Status: AC
  Filled 2018-04-13: qty 1000

## 2018-04-13 MED ORDER — ATORVASTATIN CALCIUM 80 MG PO TABS
80.0000 mg | ORAL_TABLET | Freq: Every day | ORAL | Status: DC
  Administered 2018-04-14 – 2018-04-15 (×2): 80 mg via ORAL
  Filled 2018-04-13 (×2): qty 1

## 2018-04-13 MED ORDER — LIDOCAINE HCL (PF) 1 % IJ SOLN
INTRAMUSCULAR | Status: AC
  Filled 2018-04-13: qty 30

## 2018-04-13 MED ORDER — ACETAMINOPHEN 325 MG PO TABS: 650.0000 mg | ORAL_TABLET | ORAL | Status: DC | PRN

## 2018-04-13 MED ORDER — HEPARIN SODIUM (PORCINE) 1000 UNIT/ML IJ SOLN
INTRAMUSCULAR | Status: DC | PRN
  Administered 2018-04-13 (×2): 4000 [IU] via INTRAVENOUS
  Administered 2018-04-13: 3000 [IU] via INTRAVENOUS

## 2018-04-13 MED ORDER — SODIUM CHLORIDE 0.9 % IV SOLN: 250.0000 mL | INTRAVENOUS | Status: DC | PRN

## 2018-04-13 MED ORDER — CANGRELOR BOLUS VIA INFUSION
INTRAVENOUS | Status: DC | PRN
  Administered 2018-04-13: 2220 ug via INTRAVENOUS

## 2018-04-13 MED ORDER — LIDOCAINE HCL (PF) 1 % IJ SOLN
INTRAMUSCULAR | Status: DC | PRN
  Administered 2018-04-13: 2 mL
  Administered 2018-04-13: 15 mL

## 2018-04-13 MED ORDER — PANTOPRAZOLE SODIUM 40 MG PO TBEC
40.0000 mg | DELAYED_RELEASE_TABLET | Freq: Every day | ORAL | Status: DC
  Administered 2018-04-14 – 2018-04-16 (×3): 40 mg via ORAL
  Filled 2018-04-13 (×3): qty 1

## 2018-04-13 MED ORDER — NITROGLYCERIN 1 MG/10 ML FOR IR/CATH LAB
INTRA_ARTERIAL | Status: AC
  Filled 2018-04-13: qty 10

## 2018-04-13 MED ORDER — TICAGRELOR 90 MG PO TABS
90.0000 mg | ORAL_TABLET | Freq: Two times a day (BID) | ORAL | Status: DC
  Administered 2018-04-14 – 2018-04-16 (×5): 90 mg via ORAL
  Filled 2018-04-13 (×5): qty 1

## 2018-04-13 MED ORDER — ASPIRIN 81 MG PO CHEW: 324.0000 mg | CHEWABLE_TABLET | ORAL | Status: DC

## 2018-04-13 SURGICAL SUPPLY — 34 items
BALLN SAPPHIRE 2.0X12 (BALLOONS) ×2
BALLN SAPPHIRE ~~LOC~~ 3.5X8 (BALLOONS) ×1 IMPLANT
BALLN WOLVERINE 2.50X10 (BALLOONS) ×2
BALLOON SAPPHIRE 2.0X12 (BALLOONS) IMPLANT
BALLOON WOLVERINE 2.50X10 (BALLOONS) IMPLANT
CATH 5FR JL3.5 JR4 ANG PIG MP (CATHETERS) ×1 IMPLANT
CATH LAUNCHER 5F 3DRIGHT (CATHETERS) IMPLANT
CATH LAUNCHER 5F IMA (CATHETERS) IMPLANT
CATH LAUNCHER 5F JR4 (CATHETERS) ×1 IMPLANT
CATH LAUNCHER 6FR AL1 (CATHETERS) IMPLANT
CATH VISTA GUIDE 6FR 3DRC (CATHETERS) ×1 IMPLANT
CATH VISTA GUIDE 6FR JR4 (CATHETERS) ×1 IMPLANT
CATHETER LAUNCHER 5F 3DRIGHT (CATHETERS) ×2
CATHETER LAUNCHER 5F IMA (CATHETERS) ×2
CATHETER LAUNCHER 6FR AL1 (CATHETERS) ×2
DEVICE RAD COMP TR BAND LRG (VASCULAR PRODUCTS) ×1 IMPLANT
GLIDESHEATH SLEND SS 6F .021 (SHEATH) ×1 IMPLANT
GUIDEWIRE INQWIRE 1.5J.035X260 (WIRE) IMPLANT
INQWIRE 1.5J .035X260CM (WIRE) ×2
KIT ENCORE 26 ADVANTAGE (KITS) ×1 IMPLANT
KIT HEART LEFT (KITS) ×2 IMPLANT
KIT HEMO VALVE WATCHDOG (MISCELLANEOUS) ×1 IMPLANT
KIT MICROPUNCTURE NIT STIFF (SHEATH) ×1 IMPLANT
PACK CARDIAC CATHETERIZATION (CUSTOM PROCEDURE TRAY) ×2 IMPLANT
PINNACLE LONG 6F 25CM (SHEATH) ×2
SHEATH INTRO PINNACLE 6F 25CM (SHEATH) IMPLANT
SHEATH PINNACLE 6F 10CM (SHEATH) IMPLANT
SHEATH PINNACLE 7F 10CM (SHEATH) ×1 IMPLANT
SHEATH PINNACLE ST 6F 45CM (SHEATH) ×1 IMPLANT
STENT SIERRA 3.00 X 12 MM (Permanent Stent) ×1 IMPLANT
TRANSDUCER W/STOPCOCK (MISCELLANEOUS) ×2 IMPLANT
TUBING CIL FLEX 10 FLL-RA (TUBING) ×2 IMPLANT
WIRE HI TORQ VERSACORE-J 145CM (WIRE) ×1 IMPLANT
WIRE RUNTHROUGH .014X180CM (WIRE) ×1 IMPLANT

## 2018-04-13 NOTE — ED Triage Notes (Signed)
Per [employer], pt from home where he was sitting in a chair began to have sudden onset of diaphoresis, weakness, left sided chest pain. Code stemi activated. 97/60, HR 58, RR 16, spo2 98% on 2L

## 2018-04-13 NOTE — ED Provider Notes (Signed)
Emergency Department Provider Note   I have reviewed the triage vital signs and the nursing notes.   HISTORY  Chief Complaint Code STEMI   HPI Vincent Savage. is a 84 y.o. male with PMH of aortic stenosis, diabetes, HTN, and prostate cancer since to the emergency department for evaluation of substernal chest pressure.  Symptoms began 1 hour prior to arrival.  EMS was called to the scene.  Patient had taken full dose ASA prior to EMS arrival.  They found him to be mildly hypotensive with systolic blood pressure in the 90s so did not administer nitroglycerin.  EKG showed concern for inferior wall STEMI and code STEMI was activated by EMS.  Patient reports ongoing chest pain.  No prior episodes like this in the past.  Denies dyspnea or lightheadedness.  Denies prior history of ACS.   Past Medical History:  Diagnosis Date  . Anemia   . Anxiety   . Aortic valve disorder   . Arthritis   . Barrett's esophagus 2012  . CHF (congestive heart failure) ([place])   . Coronary artery disease    mild by 2013 cath  . Depression   . Diverticulosis of colon (without mention of hemorrhage)   . Dizziness   . Dysphagia   . Dyspnea    with exertion  . GERD (gastroesophageal reflux disease)   . Hiatal hernia 2012  . Hypertension   . Kidney stones   . Murmur   . Pneumonia    " walking"  . Prostate cancer ([place])   . Rectal polyp   . Type II or unspecified type diabetes mellitus without mention of complication, not stated as uncontrolled     Patient Active Problem List   Diagnosis Date Noted  . STEMI (ST elevation myocardial infarction) ([place]) 04/13/2018  . ACS (acute coronary syndrome) ([place]) 04/13/2018  . ST elevation myocardial infarction involving right coronary artery ([place]) 04/13/2018  . Atherosclerosis of native arteries of the extremities with ulceration ([place]) 11/28/2017  . Esophageal reflux 03/24/2011  . Iron deficiency anemia, unspecified 03/24/2011  . Dysphagia 03/24/2011  . B12  deficiency 03/24/2011  . Increased storage iron 03/24/2011  . AS (aortic stenosis) 03/24/2011  . HEMATURIA, HX OF 11/29/2009  . BEN HTN HEART DISEASE WITHOUT HEART FAIL 10/27/2009  . AORTIC VALVE DISORDERS 10/27/2009  . OTHER DYSPNEA AND RESPIRATORY ABNORMALITIES 10/27/2009    Past Surgical History:  Procedure Laterality Date  . ABDOMINAL AORTOGRAM W/LOWER EXTREMITY N/A 12/06/2017   Procedure: ABDOMINAL AORTOGRAM W/LOWER EXTREMITY;  Surgeon: Conrad [place], MD;  Location: [facility];  Service: Cardiovascular;  Laterality: N/A;  . arm surgery     left  . CATARACT EXTRACTION    . ILIAC ATHERECTOMY Right 12/20/2017   Procedure: RIGHT ANTEGRADE FAILED CANNULATION OF RIGHT SUPERFICIAL FEMORAL ARTERY.;  Surgeon: Conrad Valier, MD;  Location: [facility];  Service: Vascular;  Laterality: Right;  . INGUINAL HERNIA REPAIR    . LEFT HEART CATHETERIZATION WITH CORONARY ANGIOGRAM N/A 07/23/2012   Procedure: LEFT HEART CATHETERIZATION WITH CORONARY ANGIOGRAM;  Surgeon: [name], MD;  Location: [facility];  Service: Cardiovascular;  Laterality: N/A;  . prostetic prostesis     x 2  . TRANSURETHRAL RESECTION OF PROSTATE      Allergies Ciprofloxacin  Family History  Problem Relation Age of Onset  . Ovarian cancer Sister   . Prostate cancer Brother   . Diabetes Brother   . Heart disease Father   . Colon  cancer Neg Hx     Social History Social History   Tobacco Use  . Smoking status: Never Smoker  . Smokeless tobacco: Never Used  Substance Use Topics  . Alcohol use: No  . Drug use: No    Review of Systems  Constitutional: No fever/chills Eyes: No visual changes. ENT: No sore throat. Cardiovascular: Positive chest pain. Respiratory: Denies shortness of breath. Gastrointestinal: No abdominal pain.  No nausea, no vomiting.  No diarrhea.  No constipation. Genitourinary: Negative for dysuria. Musculoskeletal: Negative for back pain. Skin: Negative for rash. Neurological:  Negative for headaches, focal weakness or numbness.  10-point ROS otherwise negative.  ____________________________________________   PHYSICAL EXAM:  VITAL SIGNS: Vitals:   04/13/18 2300 04/13/18 2315  BP:    Pulse:    Resp: 20 (!) 33  Temp:    SpO2: 99% 100%   Constitutional: Alert but appears ill. Pale and slow to respond at times.  Eyes: Conjunctivae are normal. PERRL. Head: Atraumatic. Nose: No congestion/rhinnorhea. Mouth/Throat: Mucous membranes are dry.  Neck: No stridor.  Cardiovascular: Normal rate, regular rhythm. Good peripheral circulation. Grossly normal heart sounds.   Respiratory: Normal respiratory effort.  No retractions. Lungs CTAB. Gastrointestinal: Soft and nontender. No distention.  Musculoskeletal: No lower extremity tenderness nor edema. No gross deformities of extremities. Neurologic:  Normal speech and language. No gross focal neurologic deficits are appreciated.  Skin:  Skin is warm, dry and intact. No rash noted.  ____________________________________________   LABS (all labs ordered are listed, but only abnormal results are displayed)  Labs Reviewed  CBC WITH DIFFERENTIAL/PLATELET - Abnormal; Notable for the following components:      Result Value   WBC 11.7 (*)    RBC 3.30 (*)    Hemoglobin 10.0 (*)    HCT 31.5 (*)    All other components within normal limits  COMPREHENSIVE METABOLIC PANEL - Abnormal; Notable for the following components:   CO2 17 (*)    Glucose, Bld 330 (*)    BUN 27 (*)    Calcium 8.4 (*)    Total Protein 6.3 (*)    Albumin 3.4 (*)    GFR calc non Af Amer 49 (*)    GFR calc Af Amer 57 (*)    All other components within normal limits  LIPID PANEL - Abnormal; Notable for the following components:   Triglycerides 161 (*)    HDL 23 (*)    All other components within normal limits  MRSA PCR SCREENING  PROTIME-INR  APTT  TROPONIN I  COMPREHENSIVE METABOLIC PANEL  CBC WITH DIFFERENTIAL/PLATELET  LACTIC ACID,  PLASMA  LACTIC ACID, PLASMA  BASIC METABOLIC PANEL  CBC  CBC   ____________________________________________  EKG   EKG Interpretation  Date/Time:  Saturday April 13 2018 19:48:28 EDT Ventricular Rate:  58 PR Interval:    QRS Duration: 130 QT Interval:  471 QTC Calculation: 463 R Axis:   -19 Text Interpretation:  Sinus or ectopic atrial rhythm Supraventricular bigeminy Short PR interval Right bundle branch block Inferior infarct, acute (RCA) ** ** ACUTE MI / STEMI ** ** Confirmed by Nanda Quinton [phone]) on 04/13/2018 7:58:35 PM       ____________________________________________  RADIOLOGY  No results found.  ____________________________________________   PROCEDURES  Procedure(s) performed:   Procedures  CRITICAL CARE Performed by: Margette Fast Total critical care time: 10 minutes Critical care time was exclusive of separately billable procedures and treating other patients. Critical care was necessary to treat or prevent imminent  or life-threatening deterioration. Critical care was time spent personally by me on the following activities: development of treatment plan with patient and/or surrogate as well as nursing, discussions with consultants, evaluation of patient's response to treatment, examination of patient, obtaining history from patient or surrogate, ordering and performing treatments and interventions, ordering and review of laboratory studies, ordering and review of radiographic studies, pulse oximetry and re-evaluation of patient's condition.  Nanda Quinton, MD Emergency Medicine  ____________________________________________   INITIAL IMPRESSION / ASSESSMENT AND PLAN / ED COURSE  Pertinent labs & imaging results that were available during my care of the patient were reviewed by me and considered in my medical decision making (see chart for details).  Emergency department for evaluation of STEMI.  He is actively having chest pain but is mildly  hypotensive.  Deferred nitroglycerin.  Cardiology met the patient upon arrival and agrees with heparin.  Plan for left heart cath emergently. Patient with known severe AS but no history of repair.   Labs reviewed with no acute findings.   Discussed patient's case with Cardiology to request admission. Patient and family (if present) updated with plan. Care transferred to Cardiology service.  I reviewed all nursing notes, vitals, pertinent old records, EKGs, labs, imaging (as available).  ____________________________________________  FINAL CLINICAL IMPRESSION(S) / ED DIAGNOSES  Final diagnoses:  ST elevation myocardial infarction (STEMI), unspecified artery (HCC)  Hypotension, unspecified hypotension type    MEDICATIONS GIVEN DURING THIS VISIT:  Medications  0.9 %  sodium chloride infusion (has no administration in time range)  aspirin EC tablet 81 mg (has no administration in time range)  acetaminophen (TYLENOL) tablet 650 mg (has no administration in time range)  ondansetron (ZOFRAN) injection 4 mg (has no administration in time range)  pantoprazole (PROTONIX) EC tablet 40 mg (has no administration in time range)  hydrALAZINE (APRESOLINE) injection 5 mg (has no administration in time range)  heparin injection 5,000 Units (has no administration in time range)  0.9 %  sodium chloride infusion (has no administration in time range)  sodium chloride flush (NS) 0.9 % injection 3 mL (has no administration in time range)  sodium chloride flush (NS) 0.9 % injection 3 mL (has no administration in time range)  0.9 %  sodium chloride infusion (has no administration in time range)  atorvastatin (LIPITOR) tablet 80 mg (has no administration in time range)  labetalol (NORMODYNE,TRANDATE) injection 10 mg (has no administration in time range)  ticagrelor (BRILINTA) tablet 90 mg (has no administration in time range)  cangrelor (KENGREAL) 50 mg in sodium chloride 0.9 % 250 mL (0.2 mg/mL) infusion (has  no administration in time range)  hydrALAZINE (APRESOLINE) injection 5-10 mg (has no administration in time range)  heparin 5000 UNIT/ML injection (  Given 04/13/18 1955)  0.9 %  sodium chloride infusion (  Rate/Dose Verify 04/13/18 2300)  cangrelor (KENGREAL) 50,000 mcg in sodium chloride 0.9 % 250 mL (200 mcg/mL) infusion (  Restarted 04/13/18 2300)    Note:  This document was prepared using Dragon voice recognition software and may include unintentional dictation errors.  Nanda Quinton, MD Emergency Medicine    Saeed Toren, [facility], MD 04/13/18 [phone]

## 2018-04-13 NOTE — H&P (Signed)
History & Physical    Patient ID: Vincent Savage Sr. MRN: [id number], DOB/AGE: [birth year]   Admit date: 04/13/2018   Primary Physician: Dione Housekeeper, MD Primary Cardiologist: No primary care provider on file.  Patient Profile    84 y o man with STEMI  Past Medical History    Past Medical History:  Diagnosis Date  . Anemia   . Anxiety   . Aortic valve disorder   . Arthritis   . Barrett's esophagus 2012  . CHF (congestive heart failure) ([place])   . Coronary artery disease    mild by 2013 cath  . Depression   . Diverticulosis of colon (without mention of hemorrhage)   . Dizziness   . Dysphagia   . Dyspnea    with exertion  . GERD (gastroesophageal reflux disease)   . Hiatal hernia 2012  . Hypertension   . Kidney stones   . Murmur   . Pneumonia    " walking"  . Prostate cancer ([place])   . Rectal polyp   . Type II or unspecified type diabetes mellitus without mention of complication, not stated as uncontrolled     Past Surgical History:  Procedure Laterality Date  . ABDOMINAL AORTOGRAM W/LOWER EXTREMITY N/A 12/06/2017   Procedure: ABDOMINAL AORTOGRAM W/LOWER EXTREMITY;  Surgeon: Conrad Wacousta, MD;  Location: [facility];  Service: Cardiovascular;  Laterality: N/A;  . arm surgery     left  . CATARACT EXTRACTION    . ILIAC ATHERECTOMY Right 12/20/2017   Procedure: RIGHT ANTEGRADE FAILED CANNULATION OF RIGHT SUPERFICIAL FEMORAL ARTERY.;  Surgeon: Conrad [place], MD;  Location: [facility];  Service: Vascular;  Laterality: Right;  . INGUINAL HERNIA REPAIR    . LEFT HEART CATHETERIZATION WITH CORONARY ANGIOGRAM N/A 07/23/2012   Procedure: LEFT HEART CATHETERIZATION WITH CORONARY ANGIOGRAM;  Surgeon: [name], MD;  Location: [facility];  Service: Cardiovascular;  Laterality: N/A;  . prostetic prostesis     x 2  . TRANSURETHRAL RESECTION OF PROSTATE       Allergies  Allergies  Allergen Reactions  . Ciprofloxacin Other (See Comments)    REACTION:  Questions rx d/t feeling bad that relieved after stopping Cipro    History of Present Illness    This is a 84 y o man with a pMH of severe AS, non obstructive CAD,  PAD, aortic aneurysm, NIDDM who presents from home with acute onset of chest pain. Pain started <1 h ago. Never had pain like that before . He is followed by [facility] as outpatient, and saw him just few weeks ago. He has a diagnosis of critical AS. I dont have access to the outside images but did discuss this case with [place]. It was determined that he is not a SAVR or TAVR candidate given his co morbidities and age. He did recently undergo a peripheral cath for his right foot ulcer in an attempt to revasc his super fem artery. However without success. This was in March. The patient and his family report a god functional status. He drives himself and does all his and his wife's ADL. He denies any new meds, any symptoms of CP or new/worsening SOB prior to today's episode.   He gor 4 baby ASA in the ambulance. His BP was down to 90/50. Patient is cold and feels as such. Admission temp was 93 degrees.  Admission ECG with inferior STEMI. STEMI alert was placed in route by EMS  Home Medications  Prior to Admission medications   Medication Sig Start Date End Date Taking? Authorizing Provider  acetaminophen (TYLENOL ARTHRITIS PAIN) 650 MG CR tablet Take 650 mg by mouth 2 (two) times daily.     [provider]  amLODipine (NORVASC) 10 MG tablet Take 10 mg by mouth daily.     [provider]  aspirin 81 MG tablet Take 81 mg by mouth daily.     [provider]  cadexomer iodine (IODOSORB) 0.9 % gel Apply 1 application topically every 3 (three) days.    [provider]  Cholecalciferol (VITAMIN D3 PO) Take 1 capsule by mouth daily.    [provider]  collagenase (SANTYL) ointment Apply 1 application topically daily. Apply to Right great toe ulcer daily. Apply wet-to-dry dressing on top Santyl.  12/20/17   Conrad [place], MD  cyanocobalamin (,VITAMIN B-12,) 1000 MCG/ML injection Patient wishes to have b12 injection done at Dr [name] office I have advised him to call and set these up since I do not know the office policy.  Inject one ML IM once a week for three weeks, then inject one ML Im once a month for one year. Then Patient needs repeat labs done to recheck his b12 level. Patient taking differently: Inject 1,000 mcg into the muscle every 30 (thirty) days.  02/27/11   Sable Feil, MD  escitalopram (LEXAPRO) 10 MG tablet Take 10 mg by mouth at bedtime.     [provider]  fluticasone (FLONASE) 50 MCG/ACT nasal spray Place 2 sprays into both nostrils daily as needed for allergies or rhinitis.    [provider]  latanoprost (XALATAN) 0.005 % ophthalmic solution Place 1 drop into both eyes at bedtime.    [provider]  losartan-hydrochlorothiazide (HYZAAR) 100-25 MG per tablet Take 1 tablet by mouth daily.    [provider]  omeprazole (PRILOSEC) 20 MG capsule Take 20 mg by mouth daily.    [provider]    Family History    Family History  Problem Relation Age of Onset  . Ovarian cancer Sister   . Prostate cancer Brother   . Diabetes Brother   . Heart disease Father   . Colon cancer Neg Hx    indicated that the status of his father is unknown. He indicated that the status of his sister is unknown. He indicated that the status of his neg hx is unknown.   Social History    Social History   Socioeconomic History  . Marital status: Married    Spouse name: Not on file  . Number of children: Not on file  . Years of education: Not on file  . Highest education level: Not on file  Occupational History  . Not on file  Social Needs  . Financial resource strain: Not on file  . Food insecurity:    Worry: Not on file    Inability: Not on file  . Transportation needs:    Medical: Not on file    Non-medical: Not on file    Tobacco Use  . Smoking status: Never Smoker  . Smokeless tobacco: Never Used  Substance and Sexual Activity  . Alcohol use: No  . Drug use: No  . Sexual activity: Not on file  Lifestyle  . Physical activity:    Days per week: Not on file    Minutes per session: Not on file  . Stress: Not on file  Relationships  . Social connections:  Talks on phone: Not on file    Gets together: Not on file    Attends religious service: Not on file    Active member of club or organization: Not on file    Attends meetings of clubs or organizations: Not on file    Relationship status: Not on file  . Intimate partner violence:    Fear of current or ex partner: Not on file    Emotionally abused: Not on file    Physically abused: Not on file    Forced sexual activity: Not on file  Other Topics Concern  . Not on file  Social History Narrative  . Not on file     Review of Systems    General:  No chills, fever, night sweats or weight changes.  Cardiovascular:  No chest pain, dyspnea on exertion, edema, orthopnea, palpitations, paroxysmal nocturnal dyspnea. Dermatological: No rash, lesions/masses Respiratory: No cough, dyspnea Urologic: No hematuria, dysuria Abdominal:   No nausea, vomiting, diarrhea, bright red blood per rectum, melena, or hematemesis Neurologic:  No visual changes, wkns, changes in mental status. All other systems reviewed and are otherwise negative except as noted above.  Physical Exam    Blood pressure (!) 92/50, pulse (!) 56, resp. rate (!) 21, weight 74 kg (163 lb 2.3 oz), SpO2 99 %.  General: hard of hearing, in pain, cold to touch Psych: Normal affect. Neuro: Alert and oriented X 3. Moves all extremities spontaneously. HEENT: Normal  Neck: JVD is elevated Lungs:  Resp regular and lababored Heart: RRR no s3, s4, or murmurs. Abdomen: Soft, non-tender, non-distended, Extremities: No clubbing, cyanosis or edema.   Labs    Troponin (Point of Care Test) No  results for input(s): TROPIPOC in the last 72 hours. Recent Labs    04/13/18 1958  TROPONINI <0.03   Lab Results  Component Value Date   WBC 11.7 (H) 04/13/2018   HGB 10.0 (L) 04/13/2018   HCT 31.5 (L) 04/13/2018   MCV 95.5 04/13/2018   PLT 273 04/13/2018    Recent Labs  Lab 04/13/18 1958  NA 137  K 3.6  CL 105  CO2 17*  BUN 27*  CREATININE 1.18  CALCIUM 8.4*  PROT 6.3*  BILITOT 0.8  ALKPHOS 41  ALT 21  AST 27  GLUCOSE 330*   Lab Results  Component Value Date   CHOL 141 04/13/2018   HDL 23 (L) 04/13/2018   LDLCALC 86 04/13/2018   TRIG 161 (H) 04/13/2018   No results found for: [facility]   Radiology Studies    No results found.  ECG & Cardiac Imaging    Inferior stemi, junctional   Assessment & Plan    STEMI: In critical condition. Known non obstructive CAD. Discussed with patient and family and they would; like to proceed with cath despite that understanding for procedural risk and his all over critical state. His potential coronary lesion is very likely exacerbated by the critical AS. Appears that he might be in shock now. Might require inotropes. Will need to have an ongoing goals of care discussion. As off now however full code. Discussed with patient, son and wife  Plan:  - [facility] - admission to CCU  Critical AS: Previously determined not to be a TAVR / SAVR candidate.  Will need to balance pre/afterload and inotropy in setting of his shock and severe AS.    Signed, Cristina Gong, MD 04/13/2018, 9:12 PM

## 2018-04-13 NOTE — ED Notes (Signed)
Pt taken to cath lab by this RN, cardiologist, and EMT. Pt lethargic but axox4 and stated that he wanted to go to the cath lab.

## 2018-04-14 ENCOUNTER — Other Ambulatory Visit (HOSPITAL_COMMUNITY): Payer: Medicare Other

## 2018-04-14 ENCOUNTER — Other Ambulatory Visit: Payer: Self-pay

## 2018-04-14 LAB — BASIC METABOLIC PANEL
Anion gap: 10 (ref 5–15)
BUN: 25 mg/dL — AB (ref 8–23)
CO2: 21 mmol/L — ABNORMAL LOW (ref 22–32)
CREATININE: 1.01 mg/dL (ref 0.61–1.24)
Calcium: 8.6 mg/dL — ABNORMAL LOW (ref 8.9–10.3)
Chloride: 107 mmol/L (ref 98–111)
GFR, EST NON AFRICAN AMERICAN: 60 mL/min — AB (ref >60)
Glucose, Bld: 150 mg/dL — ABNORMAL HIGH (ref 70–99)
POTASSIUM: 3.8 mmol/L (ref 3.5–5.1)
SODIUM: 138 mmol/L (ref 135–145)

## 2018-04-14 LAB — GLUCOSE, CAPILLARY
GLUCOSE-CAPILLARY: 127 mg/dL — AB (ref 70–99)
GLUCOSE-CAPILLARY: 140 mg/dL — AB (ref 70–99)
GLUCOSE-CAPILLARY: 173 mg/dL — AB (ref 70–99)
GLUCOSE-CAPILLARY: 238 mg/dL — AB (ref 70–99)
Glucose-Capillary: 128 mg/dL — ABNORMAL HIGH (ref 70–99)
Glucose-Capillary: 134 mg/dL — ABNORMAL HIGH (ref 70–99)

## 2018-04-14 LAB — TROPONIN I
Troponin I: >65 ng/mL (ref <0.03)
Troponin I: 59.59 ng/mL (ref <0.03)
Troponin I: 54.33 ng/mL (ref <0.03)

## 2018-04-14 LAB — LACTIC ACID, PLASMA
LACTIC ACID, VENOUS: 4 mmol/L — AB (ref 0.5–1.9)
LACTIC ACID, VENOUS: 2 mmol/L — AB (ref 0.5–1.9)
LACTIC ACID, VENOUS: 2 mmol/L — AB (ref 0.5–1.9)
Lactic Acid, Venous: 2.6 mmol/L (ref 0.5–1.9)

## 2018-04-14 LAB — CBC
HCT: 28.6 % — ABNORMAL LOW (ref 39.0–52.0)
Hemoglobin: 9.5 g/dL — ABNORMAL LOW (ref 13.0–17.0)
MCH: 30.6 pg (ref 26.0–34.0)
MCHC: 33.2 g/dL (ref 30.0–36.0)
MCV: 92.3 fL (ref 78.0–100.0)
PLATELETS: 225 10*3/uL (ref 150–400)
RBC: 3.1 MIL/uL — ABNORMAL LOW (ref 4.22–5.81)
RDW: 13.8 % (ref 11.5–15.5)
WBC: 9.9 10*3/uL (ref 4.0–10.5)

## 2018-04-14 LAB — COMPREHENSIVE METABOLIC PANEL
ALBUMIN: 3.6 g/dL (ref 3.5–5.0)
ALT: 42 U/L (ref 0–44)
ANION GAP: 14 (ref 5–15)
AST: 150 U/L — ABNORMAL HIGH (ref 15–41)
Alkaline Phosphatase: 44 U/L (ref 38–126)
BILIRUBIN TOTAL: 0.6 mg/dL (ref 0.3–1.2)
BUN: 26 mg/dL — ABNORMAL HIGH (ref 8–23)
CO2: 18 mmol/L — ABNORMAL LOW (ref 22–32)
Calcium: 8.4 mg/dL — ABNORMAL LOW (ref 8.9–10.3)
Chloride: 104 mmol/L (ref 98–111)
Creatinine, Ser: 1.18 mg/dL (ref 0.61–1.24)
GFR, EST AFRICAN AMERICAN: 57 mL/min — AB (ref >60)
GFR, EST NON AFRICAN AMERICAN: 49 mL/min — AB (ref >60)
GLUCOSE: 249 mg/dL — AB (ref 70–99)
POTASSIUM: 3.8 mmol/L (ref 3.5–5.1)
Sodium: 136 mmol/L (ref 135–145)
TOTAL PROTEIN: 6.6 g/dL (ref 6.5–8.1)

## 2018-04-14 LAB — POCT ACTIVATED CLOTTING TIME
ACTIVATED CLOTTING TIME: 164 s
Activated Clotting Time: 142 seconds

## 2018-04-14 LAB — MRSA PCR SCREENING: MRSA by PCR: NEGATIVE

## 2018-04-14 MED ORDER — INSULIN ASPART 100 UNIT/ML ~~LOC~~ SOLN
0.0000 [IU] | SUBCUTANEOUS | Status: DC
  Administered 2018-04-14: 2 [IU] via SUBCUTANEOUS
  Administered 2018-04-14 (×3): 1 [IU] via SUBCUTANEOUS
  Administered 2018-04-14: 3 [IU] via SUBCUTANEOUS
  Administered 2018-04-14: 1 [IU] via SUBCUTANEOUS
  Administered 2018-04-15 (×2): 2 [IU] via SUBCUTANEOUS
  Administered 2018-04-15: 1 [IU] via SUBCUTANEOUS

## 2018-04-14 NOTE — Progress Notes (Deleted)
CRITICAL VALUE ALERT  Critical Value:  Lactic Acid 2.0, Critical Troponin 0.38  Date & Time Notied:  04/14/2018 09:30 Face to Face.  Provider Notified: Kipp Brood, MD  Orders Received/Actions taken: No new orders at this time.   Lucius Conn, RN

## 2018-04-14 NOTE — Progress Notes (Addendum)
Right groin arterial sheath removed by Casilda Carls RN, witnessed by Eustace Quail., RN. Manual pressure held for 20 minutes. Pt vitals signs stable throughout. Site observed to be  Level 0. Pressure dressing applied. Pt and family educated on post procedure precautions and verbalized understanding.

## 2018-04-14 NOTE — Progress Notes (Signed)
Critical Lab Value: Lactic: 2.0 Down from 2.6 No notification due to level trending down

## 2018-04-14 NOTE — Progress Notes (Signed)
Progress Note  Patient Name: Vincent BROYLES Sr. Date of Encounter: 04/14/2018  Primary Cardiologist: No primary care provider on file.   Subjective   No chest pain or shortness of breath  Inpatient Medications    Scheduled Meds: . aspirin EC  81 mg Oral Daily  . atorvastatin  80 mg Oral q1800  . heparin  5,000 Units Subcutaneous Q8H  . insulin aspart  0-9 Units Subcutaneous Q4H  . pantoprazole  40 mg Oral Daily  . sodium chloride flush  3 mL Intravenous Q12H  . ticagrelor  90 mg Oral BID   Continuous Infusions: . sodium chloride Stopped (04/14/18 0132)  . sodium chloride     PRN Meds: sodium chloride, acetaminophen, hydrALAZINE, ondansetron (ZOFRAN) IV, sodium chloride flush   Vital Signs    Vitals:   04/14/18 0630 04/14/18 0700 04/14/18 0749 04/14/18 0800  BP: 116/63 117/65  123/67  Pulse:      Resp: 11 (!) 7 20 18   Temp:   98.3 F (36.8 C)   TempSrc:   Oral   SpO2: 97% 96%  98%  Weight:      Height:        Intake/Output Summary (Last 24 hours) at 04/14/2018 4818 Last data filed at 04/14/2018 0740 Gross per 24 hour  Intake 520.64 ml  Output 800 ml  Net -279.36 ml   Filed Weights   04/13/18 1900 04/14/18 0122  Weight: 163 lb 2.3 oz (74 kg) 164 lb 0.4 oz (74.4 kg)    Telemetry    Normal sinus rhythm with sinus bradycardia- Personally Reviewed  ECG    Normal sinus rhythm with right bundle branch block, and old inferior MI- Personally Reviewed  Physical Exam   GEN:  Pleasant, elderly appearing man, no acute distress.   Neck:  7 cm JVD Cardiac: RRR, 2 out of 6 systolic murmur consistent with aortic stenosis, rubs, or gallops.  Respiratory: Clear to auscultation bilaterally. GI: Soft, nontender, non-distended  MS: No edema; No deformity. Neuro:  Nonfocal  Psych: Normal affect   Labs    Chemistry Recent Labs  Lab 04/13/18 1958 04/13/18 2330 04/14/18 0330  NA 137 136 138  K 3.6 3.8 3.8  CL 105 104 107  CO2 17* 18* 21*  GLUCOSE 330*  249* 150*  BUN 27* 26* 25*  CREATININE 1.18 1.18 1.01  CALCIUM 8.4* 8.4* 8.6*  PROT 6.3* 6.6  --   ALBUMIN 3.4* 3.6  --   AST 27 150*  --   ALT 21 42  --   ALKPHOS 41 44  --   BILITOT 0.8 0.6  --   GFRNONAA 49* 49* 60*  GFRAA 57* 57* >60  ANIONGAP 15 14 10      Hematology Recent Labs  Lab 04/13/18 1958 04/13/18 2330 04/14/18 0330  WBC 11.7* 13.3* 9.9  RBC 3.30* 3.29* 3.10*  HGB 10.0* 10.0* 9.5*  HCT 31.5* 30.3* 28.6*  MCV 95.5 92.1 92.3  MCH 30.3 30.4 30.6  MCHC 31.7 33.0 33.2  RDW 13.8 13.6 13.8  PLT 273 239 225    Cardiac Enzymes Recent Labs  Lab 04/13/18 1958  TROPONINI <0.03   No results for input(s): TROPIPOC in the last 168 hours.   BNPNo results for input(s): BNP, PROBNP in the last 168 hours.   DDimer No results for input(s): DDIMER in the last 168 hours.   Radiology    No results found.  Cardiac Studies   None  Patient Profile  84 y.o. male admitted with an acute inferior myocardial infarction  Assessment & Plan    1.  Acute inferior myocardial infarction -he is status post PCI with good result.  Continue current therapy 2.  Aortic stenosis -this is been evaluated by Dr. Terrence Dupont.  I am not sure if he has been considered for TAVR.  The patient is amazingly vigorous but is limited by pain in his legs. 3.  Peripheral vascular disease -he is status post attempted revascularization of his right lower extremity for a nonhealing ulcer.  Fortunately his ulcer has healed. 4.  Dyslipidemia -he will continue high-dose statin therapy. Vincent Peru, MD  For questions or updates, please contact [facility] Please consult [email or web address] for contact info under Cardiology/STEMI.      Signed, Vincent Peru, MD  04/14/2018, 9:21 AM  Patient ID: Vincent Jensen., male   DOB: 12-Mar-1918, 84 y.o.   MRN: [id number]

## 2018-04-14 NOTE — Progress Notes (Signed)
Critical Lab: Lactic 2.6  Down from 4.0 No need to notify physician again since value is trending down

## 2018-04-14 NOTE — Progress Notes (Signed)
Patient ambulated 370 ft with 4 wheel walker on room air. Pt tolerated walking well. Pt now resting in bed.  Lucius Conn, RN

## 2018-04-14 NOTE — Progress Notes (Signed)
Pt HR to 38. Strip saved and at bedside for MD.  Pt HR now back to NSR in 80's. BP stable. Pt alert and resting in bed. PA paged. Will continue to monitor.  Lucius Conn, RN

## 2018-04-14 NOTE — Progress Notes (Deleted)
Patient arrived from [place] at 0400. Vitals stable, currently in A.Fib and on 4L [place]. Patient will arouse to voice and squeeze hands on commands (will not follow any other commands). Patient remains lethargic and confused. Will continue to monitor for remainder of shift.

## 2018-04-14 NOTE — Progress Notes (Addendum)
Critical Lab value paged to Dr. Raiford Simmonds. Lactic 4.0

## 2018-04-15 ENCOUNTER — Other Ambulatory Visit (HOSPITAL_COMMUNITY): Payer: Medicare Other

## 2018-04-15 ENCOUNTER — Encounter (HOSPITAL_COMMUNITY): Payer: Self-pay | Admitting: Internal Medicine

## 2018-04-15 DIAGNOSIS — I350 Nonrheumatic aortic (valve) stenosis: Secondary | ICD-10-CM

## 2018-04-15 LAB — BASIC METABOLIC PANEL
Anion gap: 10 (ref 5–15)
BUN: 20 mg/dL (ref 8–23)
CALCIUM: 9.1 mg/dL (ref 8.9–10.3)
CO2: 25 mmol/L (ref 22–32)
CREATININE: 0.97 mg/dL (ref 0.61–1.24)
Chloride: 104 mmol/L (ref 98–111)
Glucose, Bld: 140 mg/dL — ABNORMAL HIGH (ref 70–99)
Potassium: 3.7 mmol/L (ref 3.5–5.1)
SODIUM: 139 mmol/L (ref 135–145)

## 2018-04-15 LAB — GLUCOSE, CAPILLARY
GLUCOSE-CAPILLARY: 120 mg/dL — AB (ref 70–99)
GLUCOSE-CAPILLARY: 130 mg/dL — AB (ref 70–99)
GLUCOSE-CAPILLARY: 140 mg/dL — AB (ref 70–99)
Glucose-Capillary: 183 mg/dL — ABNORMAL HIGH (ref 70–99)
Glucose-Capillary: 130 mg/dL — ABNORMAL HIGH (ref 70–99)
Glucose-Capillary: 142 mg/dL — ABNORMAL HIGH (ref 70–99)

## 2018-04-15 LAB — CBC
HCT: 31.5 % — ABNORMAL LOW (ref 39.0–52.0)
Hemoglobin: 10 g/dL — ABNORMAL LOW (ref 13.0–17.0)
MCH: 30 pg (ref 26.0–34.0)
MCHC: 31.7 g/dL (ref 30.0–36.0)
MCV: 94.6 fL (ref 78.0–100.0)
Platelets: 249 10*3/uL (ref 150–400)
RBC: 3.33 MIL/uL — ABNORMAL LOW (ref 4.22–5.81)
RDW: 14.1 % (ref 11.5–15.5)
WBC: 8.6 10*3/uL (ref 4.0–10.5)

## 2018-04-15 LAB — POCT I-STAT, CHEM 8
BUN: 29 mg/dL — AB (ref 8–23)
CALCIUM ION: 1.15 mmol/L (ref 1.15–1.40)
CREATININE: 1 mg/dL (ref 0.61–1.24)
Chloride: 103 mmol/L (ref 98–111)
Glucose, Bld: 344 mg/dL — ABNORMAL HIGH (ref 70–99)
HCT: 28 % — ABNORMAL LOW (ref 39.0–52.0)
Hemoglobin: 9.5 g/dL — ABNORMAL LOW (ref 13.0–17.0)
Potassium: 3.5 mmol/L (ref 3.5–5.1)
Sodium: 138 mmol/L (ref 135–145)
TCO2: 17 mmol/L — ABNORMAL LOW (ref 22–32)

## 2018-04-15 LAB — POCT ACTIVATED CLOTTING TIME
ACTIVATED CLOTTING TIME: 252 s
Activated Clotting Time: 175 seconds
Activated Clotting Time: 224 seconds

## 2018-04-15 LAB — POCT I-STAT 3, ART BLOOD GAS (G3+)
ACID-BASE DEFICIT: 9 mmol/L — AB (ref 0.0–2.0)
BICARBONATE: 16.2 mmol/L — AB (ref 20.0–28.0)
O2 SAT: 96 %
PO2 ART: 86 mmHg (ref 83.0–108.0)
TCO2: 17 mmol/L — AB (ref 22–32)
pCO2 arterial: 31.2 mmHg — ABNORMAL LOW (ref 32.0–48.0)
pH, Arterial: 7.322 — ABNORMAL LOW (ref 7.350–7.450)

## 2018-04-15 MED ORDER — INSULIN ASPART 100 UNIT/ML ~~LOC~~ SOLN: 0.0000 [IU] | Freq: Every day | SUBCUTANEOUS | Status: DC

## 2018-04-15 MED ORDER — INSULIN ASPART 100 UNIT/ML ~~LOC~~ SOLN
0.0000 [IU] | Freq: Three times a day (TID) | SUBCUTANEOUS | Status: DC
  Administered 2018-04-15: 1 [IU] via SUBCUTANEOUS
  Administered 2018-04-16: 2 [IU] via SUBCUTANEOUS

## 2018-04-15 MED ORDER — METOPROLOL TARTRATE 25 MG PO TABS
25.0000 mg | ORAL_TABLET | Freq: Two times a day (BID) | ORAL | Status: DC
  Administered 2018-04-15 – 2018-04-16 (×3): 25 mg via ORAL
  Filled 2018-04-15 (×3): qty 1

## 2018-04-15 NOTE — Progress Notes (Signed)
Progress Note  Patient Name: Vincent VERTZ Sr. Date of Encounter: 04/15/2018  Primary Cardiologist: Terrence Dupont  Subjective   No chest pain or dyspnea.   Inpatient Medications    Scheduled Meds: . aspirin EC  81 mg Oral Daily  . atorvastatin  80 mg Oral q1800  . heparin  5,000 Units Subcutaneous Q8H  . insulin aspart  0-9 Units Subcutaneous Q4H  . pantoprazole  40 mg Oral Daily  . sodium chloride flush  3 mL Intravenous Q12H  . ticagrelor  90 mg Oral BID   Continuous Infusions: . sodium chloride Stopped (04/14/18 0132)  . sodium chloride     PRN Meds: sodium chloride, acetaminophen, hydrALAZINE, ondansetron (ZOFRAN) IV, sodium chloride flush   Vital Signs    Vitals:   04/15/18 0600 04/15/18 0700 04/15/18 0752 04/15/18 0800  BP: (!) 139/59 (!) 133/59  126/62  Pulse:      Resp: 10 16  15   Temp:   98 F (36.7 C)   TempSrc:   Oral   SpO2: 97% 98%  97%  Weight:      Height:        Intake/Output Summary (Last 24 hours) at 04/15/2018 0853 Last data filed at 04/14/2018 2000 Gross per 24 hour  Intake 960 ml  Output 250 ml  Net 710 ml   Filed Weights   04/13/18 1900 04/14/18 0122  Weight: 163 lb 2.3 oz (74 kg) 164 lb 0.4 oz (74.4 kg)    Telemetry    Sinus- Personally Reviewed  ECG    No am ekg - Personally Reviewed  Physical Exam   GEN: No acute distress.   Neck: No JVD Cardiac: RRR, loud harsh systolic murmur.   Respiratory: Clear to auscultation bilaterally. GI: Soft, nontender, non-distended  MS: No edema; No deformity. Neuro:  Nonfocal  Psych: Normal affect   Labs    Chemistry Recent Labs  Lab 04/13/18 1958 04/13/18 2330 04/14/18 0330  NA 137 136 138  K 3.6 3.8 3.8  CL 105 104 107  CO2 17* 18* 21*  GLUCOSE 330* 249* 150*  BUN 27* 26* 25*  CREATININE 1.18 1.18 1.01  CALCIUM 8.4* 8.4* 8.6*  PROT 6.3* 6.6  --   ALBUMIN 3.4* 3.6  --   AST 27 150*  --   ALT 21 42  --   ALKPHOS 41 44  --   BILITOT 0.8 0.6  --   GFRNONAA 49* 49* 60*    GFRAA 57* 57* >60  ANIONGAP 15 14 10      Hematology Recent Labs  Lab 04/13/18 1958 04/13/18 2330 04/14/18 0330  WBC 11.7* 13.3* 9.9  RBC 3.30* 3.29* 3.10*  HGB 10.0* 10.0* 9.5*  HCT 31.5* 30.3* 28.6*  MCV 95.5 92.1 92.3  MCH 30.3 30.4 30.6  MCHC 31.7 33.0 33.2  RDW 13.8 13.6 13.8  PLT 273 239 225    Cardiac Enzymes Recent Labs  Lab 04/13/18 1958 04/14/18 0800 04/14/18 1604 04/14/18 2146  TROPONINI <0.03 >65.00* 59.59* 54.33*   No results for input(s): TROPIPOC in the last 168 hours.   BNPNo results for input(s): BNP, PROBNP in the last 168 hours.   DDimer No results for input(s): DDIMER in the last 168 hours.   Radiology    No results found.  Cardiac Studies   Cardiac cath 04/13/18: Diagnostic Diagram       Post-Intervention Diagram          Patient Profile     84 y.o. male  with history of PAD, CAD, HTN, severe aortic stenosis, DM admitted with an acute inferior STEMI on 04/13/18.   Assessment & Plan    1 CAD/Inferior STEMI: Pt admitted with an inferior STEMI secondary to occlusion of the ostial RCA. A drug eluting stent was placed in the proximal RCA. Non-obstructive disease elsewhere. Will plan echo today to assess LVEF. Will continue ASA and Brilinta for one year. Continue statin. Start a beta blocker today.   2. Severe aortic stenosis: He is known to have severe AS. This is followed by Dr. Terrence Dupont. He has not been felt to be a candidate for TAVR due to his age and PAD. I would agree. Will repeat echo now to assess valve.   Transfer to tele unit today. Probable d/c home tomorrow.   For questions or updates, please contact [place] Please consult [email or web address] for contact info under Cardiology/STEMI.      Signed, Lauree Chandler, MD  04/15/2018, 8:53 AM

## 2018-04-15 NOTE — Progress Notes (Signed)
CARDIAC REHAB PHASE I   PRE:  Rate/Rhythm: 81 SR    BP: sitting 134/66    SaO2: 99 RA  MODE:  Ambulation: 370 ft   POST:  Rate/Rhythm: 112 ST in bathroom, 97 SR    BP: sitting 163/77     SaO2:   Pt to BR for BM then ambulated with RW. Steady, no c/o. He did step around the RW in the bathroom, which I discussed with him for home use. He uses the rollator all the time at home. HR increases briefly at times, looks like brief SVT. Pt asx. Many PACs as well. To recliner, ed completed with pt and niece. Good reception. He tells me he does not have prescription coverage for Brilinta. CM to see later today regarding Brilinta. Will refer to [place].  [place], ACSM 04/15/2018 11:52 AM

## 2018-04-15 NOTE — Care Management Note (Addendum)
Case Management Note Marvetta Gibbons RN,BSN Unit [facility]-22 Case Manager  [phone]  Patient Details  Name: Vincent ZAPPONE Sr. MRN: [id number] Date of Birth: 1918-07-09  Subjective/Objective:    Pt admitted with STEMI s/p with start of Brilinta                Action/Plan: PTA pt lived at home, per insurance check pt does not have medicare part D, spoke with pt at bedside and confirmed that pt does not have prescription drug coverage- pt reports that he gets his medications through the [place]. Pt goes to the [facility] reports that his PCP is Dr. Alferd Patee. Call has been made to the [place] clinic and msg left for the CSW-[name] at ext. [phone]- to confirm pt's PCP there and correct fax # to send info for Brilinta needs on discharge as pt will need PCP at the [facility] to give approval for the drug in order to fill it at the [place]. CM will provide pt a 30 day free card to use on discharge to fill Brilinta at local pharmacy.  Expected Discharge Date:                  Expected Discharge Plan:  Home/Self Care  In-House Referral:     Discharge planning Services  CM Consult, Medication Assistance  Post Acute Care Choice:    Choice offered to:     DME Arranged:    DME Agency:     HH Arranged:    HH Agency:     Status of Service:  In process, will continue to follow  If discussed at Long Length of Stay Meetings, dates discussed:    Additional Comments:  04/16/18- 1330- Marvetta Gibbons RN, CM- spoke with [name] at the [facility]- confirmed pt's PCP- Dr Alferd Patee- correct Fax # is [phone] to fax d/c summary and brilinta script for f/u with PCP for approval. Have sent 6E unit CM msg.   Dawayne Patricia, RN 04/15/2018, 4:10 PM

## 2018-04-15 NOTE — Plan of Care (Signed)
  Problem: Activity: Goal: Risk for activity intolerance will decrease Outcome: Progressing   Problem: Coping: Goal: Level of anxiety will decrease Outcome: Progressing   Problem: Elimination: Goal: Will not experience complications related to urinary retention Outcome: Progressing   Problem: Pain Managment: Goal: General experience of comfort will improve Outcome: Progressing   Problem: Skin Integrity: Goal: Risk for impaired skin integrity will decrease Outcome: Progressing

## 2018-04-15 NOTE — Progress Notes (Signed)
Called report to [employer]. Patient being transferred to [place] room 24. All belongings are accounted for and family has been notified.

## 2018-04-15 NOTE — Progress Notes (Signed)
CRITICAL VALUE ALERT  Critical Value:  Troponin 54.33  Date & Time Notied:  04/15/18 @ 2155  Provider Notified: MD Akhter  Orders Received/Actions taken: No new orders received (value trending down)

## 2018-04-16 ENCOUNTER — Inpatient Hospital Stay (HOSPITAL_COMMUNITY): Payer: Medicare Other

## 2018-04-16 LAB — GLUCOSE, CAPILLARY
GLUCOSE-CAPILLARY: 105 mg/dL — AB (ref 70–99)
GLUCOSE-CAPILLARY: 151 mg/dL — AB (ref 70–99)
GLUCOSE-CAPILLARY: 152 mg/dL — AB (ref 70–99)
GLUCOSE-CAPILLARY: 157 mg/dL — AB (ref 70–99)

## 2018-04-16 LAB — BASIC METABOLIC PANEL
Anion gap: 9 (ref 5–15)
BUN: 22 mg/dL (ref 8–23)
CHLORIDE: 104 mmol/L (ref 98–111)
CO2: 24 mmol/L (ref 22–32)
Calcium: 8.8 mg/dL — ABNORMAL LOW (ref 8.9–10.3)
Creatinine, Ser: 1.07 mg/dL (ref 0.61–1.24)
GFR calc non Af Amer: 56 mL/min — ABNORMAL LOW (ref >60)
Glucose, Bld: 165 mg/dL — ABNORMAL HIGH (ref 70–99)
POTASSIUM: 3.9 mmol/L (ref 3.5–5.1)
SODIUM: 137 mmol/L (ref 135–145)

## 2018-04-16 LAB — CBC
HEMATOCRIT: 28.2 % — AB (ref 39.0–52.0)
HEMOGLOBIN: 9.2 g/dL — AB (ref 13.0–17.0)
MCH: 30.5 pg (ref 26.0–34.0)
MCHC: 32.6 g/dL (ref 30.0–36.0)
MCV: 93.4 fL (ref 78.0–100.0)
Platelets: 228 10*3/uL (ref 150–400)
RBC: 3.02 MIL/uL — AB (ref 4.22–5.81)
RDW: 13.8 % (ref 11.5–15.5)
WBC: 8 10*3/uL (ref 4.0–10.5)

## 2018-04-16 LAB — ECHOCARDIOGRAM COMPLETE
Height: 67 in
Weight: 2552 oz

## 2018-04-16 MED ORDER — ATORVASTATIN CALCIUM 80 MG PO TABS: 80.0000 mg | ORAL_TABLET | Freq: Every day | ORAL | 1 refills | Status: DC

## 2018-04-16 MED ORDER — TICAGRELOR 90 MG PO TABS: 90.0000 mg | ORAL_TABLET | Freq: Two times a day (BID) | ORAL | 2 refills | Status: DC

## 2018-04-16 MED ORDER — METOPROLOL TARTRATE 25 MG PO TABS: 25.0000 mg | ORAL_TABLET | Freq: Two times a day (BID) | ORAL | 2 refills | Status: AC

## 2018-04-16 MED ORDER — NITROGLYCERIN 0.4 MG SL SUBL: 0.4000 mg | SUBLINGUAL_TABLET | SUBLINGUAL | 2 refills | Status: AC | PRN

## 2018-04-16 NOTE — Progress Notes (Signed)
Progress Note  Patient Name: Vincent CHAPA Sr. Date of Encounter: 04/16/2018  Primary Cardiologist: Terrence Dupont  Subjective   No CP or dyspnea.   Inpatient Medications    Scheduled Meds: . aspirin EC  81 mg Oral Daily  . atorvastatin  80 mg Oral q1800  . heparin  5,000 Units Subcutaneous Q8H  . insulin aspart  0-5 Units Subcutaneous QHS  . insulin aspart  0-9 Units Subcutaneous TID WC  . metoprolol tartrate  25 mg Oral BID  . pantoprazole  40 mg Oral Daily  . sodium chloride flush  3 mL Intravenous Q12H  . ticagrelor  90 mg Oral BID   Continuous Infusions: . sodium chloride Stopped (04/14/18 0132)  . sodium chloride     PRN Meds: sodium chloride, acetaminophen, hydrALAZINE, ondansetron (ZOFRAN) IV, sodium chloride flush   Vital Signs    Vitals:   04/15/18 1453 04/15/18 1953 04/15/18 2235 04/16/18 0300  BP: (!) 156/66 (!) 146/65 (!) 151/71 (!) 157/70  Pulse:  91 95 89  Resp: 16 16  16   Temp: 98 F (36.7 C) 98.9 F (37.2 C)  98.6 F (37 C)  TempSrc: Oral Oral  Oral  SpO2: 97% 96%  95%  Weight:    159 lb 8 oz (72.3 kg)  Height:        Intake/Output Summary (Last 24 hours) at 04/16/2018 0937 Last data filed at 04/15/2018 2253 Gross per 24 hour  Intake 963 ml  Output -  Net 963 ml   Filed Weights   04/13/18 1900 04/14/18 0122 04/16/18 0300  Weight: 163 lb 2.3 oz (74 kg) 164 lb 0.4 oz (74.4 kg) 159 lb 8 oz (72.3 kg)    Telemetry    NSR- Personally Reviewed  ECG     no am ekg- Personally Reviewed  Physical Exam   General: Well developed, well nourished, NAD  HEENT: OP clear, mucus membranes moist  SKIN: warm, dry. No rashes. Neuro: No focal deficits  Musculoskeletal: Muscle strength 5/5 all ext  Psychiatric: Mood and affect normal  Neck: No JVD, no carotid bruits, no thyromegaly, no lymphadenopathy.  Lungs:Clear bilaterally, no wheezes, rhonci, crackles Cardiovascular: Regular rate and rhythm. Systolic murmur.  Abdomen:Soft. Bowel sounds  present. Non-tender.  Extremities: No lower extremity edema. Pulses are 2 + in the bilateral DP/PT.  Labs    Chemistry Recent Labs  Lab 04/13/18 1958  04/13/18 2330 04/14/18 0330 04/15/18 0959 04/16/18 0432  NA 137   < > 136 138 139 137  K 3.6   < > 3.8 3.8 3.7 3.9  CL 105   < > 104 107 104 104  CO2 17*  --  18* 21* 25 24  GLUCOSE 330*   < > 249* 150* 140* 165*  BUN 27*   < > 26* 25* 20 22  CREATININE 1.18   < > 1.18 1.01 0.97 1.07  CALCIUM 8.4*  --  8.4* 8.6* 9.1 8.8*  PROT 6.3*  --  6.6  --   --   --   ALBUMIN 3.4*  --  3.6  --   --   --   AST 27  --  150*  --   --   --   ALT 21  --  42  --   --   --   ALKPHOS 41  --  44  --   --   --   BILITOT 0.8  --  0.6  --   --   --  GFRNONAA 49*  --  49* 60* >60 56*  GFRAA 57*  --  57* >60 >60 >60  ANIONGAP 15  --  14 10 10 9    < > = values in this interval not displayed.     Hematology Recent Labs  Lab 04/14/18 0330 04/15/18 0959 04/16/18 0432  WBC 9.9 8.6 8.0  RBC 3.10* 3.33* 3.02*  HGB 9.5* 10.0* 9.2*  HCT 28.6* 31.5* 28.2*  MCV 92.3 94.6 93.4  MCH 30.6 30.0 30.5  MCHC 33.2 31.7 32.6  RDW 13.8 14.1 13.8  PLT 225 249 228    Cardiac Enzymes Recent Labs  Lab 04/13/18 1958 04/14/18 0800 04/14/18 1604 04/14/18 2146  TROPONINI <0.03 >65.00* 59.59* 54.33*   No results for input(s): TROPIPOC in the last 168 hours.   BNPNo results for input(s): BNP, PROBNP in the last 168 hours.   DDimer No results for input(s): DDIMER in the last 168 hours.   Radiology    No results found.  Cardiac Studies   Cardiac cath 04/13/18: Diagnostic Diagram       Post-Intervention Diagram          Patient Profile     84 y.o. male with history of PAD, CAD, HTN, severe aortic stenosis, DM admitted with an acute inferior STEMI on 04/13/18.   Assessment & Plan    1 CAD/Inferior STEMI: Pt admitted with an inferior STEMI secondary to occlusion of the ostial RCA. A drug eluting stent was placed in the proximal RCA.  Non-obstructive disease elsewhere. Echo ordered yesterday and still pending. Will attempt to arrange echo today. If it cannot be done today, he will need this done as an outpatient in Dr. Zenia Resides office. The echo is to assess his LVEF post MI. Continue ASA and Brilinta for one year. Continue statin and beta blocker.    2. Severe aortic stenosis: He is known to have severe AS. This is followed by Dr. Terrence Dupont. He has not been felt to be a candidate for TAVR due to his age and PAD. No plans to pursue TAVR at this time.    Will d/c home today. If echo cannot be done this am, will need as an outpatient. Follow up [place] 1-2 weeks.   For questions or updates, please contact [place] Please consult [email or web address] for contact info under Cardiology/STEMI.      Signed, Lauree Chandler, MD  04/16/2018, 9:37 AM

## 2018-04-16 NOTE — Care Management Important Message (Signed)
Important Message  Patient Details  Name: Vincent BAXENDALE Sr. MRN: [id number] Date of Birth: [birth year]   Medicare Important Message Given:  Yes    [name] 04/16/2018, 3:48 PM

## 2018-04-16 NOTE — Progress Notes (Signed)
CARDIAC REHAB PHASE I   PRE:  Rate/Rhythm: 84 SR    BP: sitting 150/73    SaO2: 95 RA  MODE:  Ambulation: 470 ft   POST:  Rate/Rhythm: 107 ST    BP: sitting 173/86     SaO2:   Pt feeling well. Sts his chest soreness is less today. Able to get out of bed and walk with RW. Steady, no c/o. Increased distance. HR more controlled today. Reviewed education. Good reception but does need reminders. [phone]  [place], ACSM 04/16/2018 10:50 AM

## 2018-04-16 NOTE — Discharge Summary (Signed)
Discharge Summary    Patient ID: Vincent Savage.,  MRN: [id number], DOB/AGE: 05/29/1918 84 y.o.  Admit date: 04/13/2018 Discharge date: 04/16/2018  Primary Care Provider: Dione Housekeeper Primary Cardiologist: Dr. Terrence Dupont    Discharge Diagnoses    Active Problems:   STEMI (ST elevation myocardial infarction) ([place])   AS (aortic stenosis)   ACS (acute coronary syndrome) (HCC)   ST elevation myocardial infarction involving right coronary artery (HCC)   Allergies Allergies  Allergen Reactions  . Ciprofloxacin Other (See Comments)    REACTION: Questions rx d/t feeling bad that relieved after stopping Cipro     Diagnostic Studies/Procedures    Cath: 04/13/18  Conclusions: 1. Severe single-vessel coronary artery disease with thrombotic occlusion (type I MI) at the ostial RCA.  There is 60% proximal RCA stenosis with diffuse calcification of the vessel. 2. Mild to moderate, nonobstructive CAD involving the left coronary artery. 3. Calcification and severe tortuosity of the right subclavian/innominate arteries and right common/external iliac arteries. 4. Challenging but ultimately successful PCI to ostial RCA using Xience Sierra 3.0 x 12 mm drug-eluting stent (postdilated to 3.6 mm) with 0% residual stenosis and TIMI-3 flow.  Door-to-balloon time was delayed due to difficult vascular access requiring radial and femoral access and multiple sheaths/catheters to engage the right coronary artery.  Recommendations: 1. Admit to ICU for post STEMI monitoring.  [facility] heart care will continue to follow the patient during this hospitalization.  Mr. Rigor will follow up with Dr. Terrence Dupont after discharge. 2. Continue cangrelor for 2 hours after ticagrelor administration. 3. Remove right femoral artery sheath with manual compression once ACT has fallen below 175 seconds. 4. Aggressive secondary prevention including high intensity statin therapy. 5. Obtain transthoracic  echocardiogram.  Recommend uninterrupted dual antiplatelet therapy with Aspirin 81mg  daily and Ticagrelor 90mg  twice daily for a minimum of 12 months (ACS - Class I recommendation).   Vincent Bush, MD [facility] _____________   History of Present Illness     70 y o man with a pMH of severe AS, non obstructive CAD,  PAD, aortic aneurysm, NIDDM who presented from home with acute onset of chest pain. Pain started <1 h prior to admission. Never had pain like that before . He is followed by [facility] as outpatient, and saw him just few weeks ago. He has a diagnosis of critical AS. It was determined that he was not a SAVR or TAVR candidate given his co morbidities and age. He did recently undergo a peripheral cath for his right foot ulcer in an attempt to revasc his super fem artery. However without success. This was in March. The patient and his family report a good functional status. He drives himself and does all his and his wife's ADL. He denied any new meds, any symptoms of CP or new/worsening SOB prior to today's episode.   He got 4 baby ASA in the ambulance. His BP was down to 90/50. Admission ECG with inferior STEMI. STEMI alert was placed in route by EMS. He was admitted and taken for emergent cardiac cath.   Hospital Course     Pt admitted with an inferior STEMI secondary to occlusion of the ostial RCA. A drug eluting stent was placed in the proximal RCA. Non-obstructive disease elsewhere. Echo ordered and done prior to discharge but read was still pending. He was placed on DAPT with ASA and Brilinta for one year. Continued on statin and beta blocker. Known to have severe AS. This is  followed by Dr. Terrence Dupont. He has not been felt to be a candidate for TAVR due to his age and PAD. No plans to pursue TAVR at this time. Troponin peaked at >65. Worked well with cardiac rehab without recurrent chest pain.   Vincent Siren Sr. was seen by Dr. Angelena Form and determined stable for discharge  home. Follow up in the office has been arranged. Medications are listed below.   _____________  Discharge Vitals Blood pressure (!) 157/70, pulse 89, temperature 98.6 F (37 C), temperature source Oral, resp. rate 16, height 5\' 7"  (1.702 m), weight 159 lb 8 oz (72.3 kg), SpO2 95 %.  Filed Weights   04/13/18 1900 04/14/18 0122 04/16/18 0300  Weight: 163 lb 2.3 oz (74 kg) 164 lb 0.4 oz (74.4 kg) 159 lb 8 oz (72.3 kg)    Labs & Radiologic Studies    CBC Recent Labs    04/13/18 1958  04/13/18 2330  04/15/18 0959 04/16/18 0432  WBC 11.7*  --  13.3*   < > 8.6 8.0  NEUTROABS 7.5  --  11.8*  --   --   --   HGB 10.0*   < > 10.0*   < > 10.0* 9.2*  HCT 31.5*   < > 30.3*   < > 31.5* 28.2*  MCV 95.5  --  92.1   < > 94.6 93.4  PLT 273  --  239   < > 249 228   < > = values in this interval not displayed.   Basic Metabolic Panel Recent Labs    04/15/18 0959 04/16/18 0432  NA 139 137  K 3.7 3.9  CL 104 104  CO2 25 24  GLUCOSE 140* 165*  BUN 20 22  CREATININE 0.97 1.07  CALCIUM 9.1 8.8*   Liver Function Tests Recent Labs    04/13/18 1958 04/13/18 2330  AST 27 150*  ALT 21 42  ALKPHOS 41 44  BILITOT 0.8 0.6  PROT 6.3* 6.6  ALBUMIN 3.4* 3.6   No results for input(s): LIPASE, AMYLASE in the last 72 hours. Cardiac Enzymes Recent Labs    04/14/18 0800 04/14/18 1604 04/14/18 2146  TROPONINI >65.00* 59.59* 54.33*   BNP Invalid input(s): POCBNP D-Dimer No results for input(s): DDIMER in the last 72 hours. Hemoglobin A1C No results for input(s): HGBA1C in the last 72 hours. Fasting Lipid Panel Recent Labs    04/13/18 1958  CHOL 141  HDL 23*  LDLCALC 86  TRIG 161*  CHOLHDL 6.1   Thyroid Function Tests No results for input(s): TSH, T4TOTAL, T3FREE, THYROIDAB in the last 72 hours.  Invalid input(s): FREET3 _____________  No results found. Disposition   Pt is being discharged home today in good condition.  Follow-up Plans & Appointments    Follow-up  Information    Charolette Forward, MD Follow up.   Specialty:  Cardiology Why:  Please call to arrange for follow up within the next 2 weeks.  Contact information: [address] [place] [address] [phone]          Discharge Instructions    Amb Referral to Cardiac Rehabilitation   Complete by:  As directed    Diagnosis:   Coronary Stents STEMI PTCA     Call MD for:  redness, tenderness, or signs of infection (pain, swelling, redness, odor or green/yellow discharge around incision site)   Complete by:  As directed    Diet - low sodium heart healthy   Complete by:  As directed    Discharge instructions   Complete by:  As directed    Radial Site Care Refer to this sheet in the next few weeks. These instructions provide you with information on caring for yourself after your procedure. Your caregiver may also give you more specific instructions. Your treatment has been planned according to current medical practices, but problems sometimes occur. Call your caregiver if you have any problems or questions after your procedure. HOME CARE INSTRUCTIONS You may shower the day after the procedure.Remove the bandage (dressing) and gently wash the site with plain soap and water.Gently pat the site dry.  Do not apply powder or lotion to the site.  Do not submerge the affected site in water for 3 to 5 days.  Inspect the site at least twice daily.  Do not flex or bend the affected arm for 24 hours.  No lifting over 5 pounds (2.3 kg) for 5 days after your procedure.  Do not drive home if you are discharged the same day of the procedure. Have someone else drive you.  You may drive 24 hours after the procedure unless otherwise instructed by your caregiver.  What to expect: Any bruising will usually fade within 1 to 2 weeks.  Blood that collects in the tissue (hematoma) may be painful to the touch. It should usually decrease in size and tenderness within 1 to 2 weeks.  SEEK  IMMEDIATE MEDICAL CARE IF: You have unusual pain at the radial site.  You have redness, warmth, swelling, or pain at the radial site.  You have drainage (other than a small amount of blood on the dressing).  You have chills.  You have a fever or persistent symptoms for more than 72 hours.  You have a fever and your symptoms suddenly get worse.  Your arm becomes pale, cool, tingly, or numb.  You have heavy bleeding from the site. Hold pressure on the site.   PLEASE DO NOT MISS ANY DOSES OF YOUR PLAVIX!!!!! Also keep a log of you blood pressures and bring back to your follow up appt. Please call the office with any questions.   Patients taking blood thinners should generally stay away from medicines like ibuprofen, Advil, Motrin, naproxen, and Aleve due to risk of stomach bleeding. You may take Tylenol as directed or talk to your primary doctor about alternatives.   Increase activity slowly   Complete by:  As directed        Discharge Medications     Medication List    STOP taking these medications   losartan-hydrochlorothiazide 100-25 MG tablet Commonly known as:  HYZAAR     TAKE these medications   amLODipine 10 MG tablet Commonly known as:  NORVASC Take 10 mg by mouth daily.   aspirin 81 MG tablet Take 81 mg by mouth at bedtime.   atorvastatin 80 MG tablet Commonly known as:  LIPITOR Take 1 tablet (80 mg total) by mouth daily at 6 PM.   cyanocobalamin 1000 MCG/ML injection Commonly known as:  (VITAMIN B-12) Patient wishes to have b12 injection done at Dr [name] office I have advised him to call and set these up since I do not know the office policy.  Inject one ML IM once a week for three weeks, then inject one ML Im once a month for one year. Then Patient needs repeat labs done to recheck his b12 level. What changed:    how much to take  how to take this  when to  take this  additional instructions   escitalopram 10 MG tablet Commonly known as:   LEXAPRO Take 10 mg by mouth at bedtime.   fluticasone 50 MCG/ACT nasal spray Commonly known as:  FLONASE Place 2 sprays into both nostrils daily as needed for allergies or rhinitis.   latanoprost 0.005 % ophthalmic solution Commonly known as:  XALATAN Place 1 drop into both eyes at bedtime.   metoprolol tartrate 25 MG tablet Commonly known as:  LOPRESSOR Take 1 tablet (25 mg total) by mouth 2 (two) times daily.   nitroGLYCERIN 0.4 MG SL tablet Commonly known as:  NITROSTAT Place 1 tablet (0.4 mg total) under the tongue every 5 (five) minutes as needed.   omeprazole 20 MG capsule Commonly known as:  PRILOSEC Take 20 mg by mouth daily.   PROVENTIL HFA 108 (90 Base) MCG/ACT inhaler Generic drug:  albuterol Inhale 2 puffs into the lungs every 6 (six) hours as needed for wheezing or shortness of breath.   ticagrelor 90 MG Tabs tablet Commonly known as:  BRILINTA Take 1 tablet (90 mg total) by mouth 2 (two) times daily.   TYLENOL ARTHRITIS PAIN 650 MG CR tablet Generic drug:  acetaminophen Take 650 mg by mouth 2 (two) times daily.        Acute coronary syndrome (MI, NSTEMI, STEMI, etc) this admission?: Yes.     AHA/ACC Clinical Performance & Quality Measures: 6. Aspirin prescribed? - Yes 7. ADP Receptor Inhibitor (Plavix/Clopidogrel, Brilinta/Ticagrelor or Effient/Prasugrel) prescribed (includes medically managed patients)? - Yes 8. Beta Blocker prescribed? - Yes 9. High Intensity Statin (Lipitor 40-80mg  or Crestor 20-40mg ) prescribed? - Yes 10. EF assessed during THIS hospitalization? - Yes 11. For EF <40%, was ACEI/ARB prescribed? - Not Applicable (EF >/= 38%) 12. For EF <40%, Aldosterone Antagonist (Spironolactone or Eplerenone) prescribed? - Not Applicable (EF >/= 93%) 13. Cardiac Rehab Phase II ordered (Included Medically managed Patients)? - Yes      Outstanding Labs/Studies   FLP/LFTs in 6 weeks if tolerating statin.   Duration of Discharge Encounter    Greater than 30 minutes including physician time.  Signed, Reino Bellis NP-C 04/16/2018, 12:00 PM

## 2018-06-25 ENCOUNTER — Other Ambulatory Visit: Payer: Self-pay | Admitting: Cardiology

## 2018-07-31 ENCOUNTER — Other Ambulatory Visit: Payer: Self-pay | Admitting: Cardiology

## 2018-08-05 ENCOUNTER — Other Ambulatory Visit: Payer: Self-pay | Admitting: Cardiology

## 2018-09-29 ENCOUNTER — Other Ambulatory Visit (HOSPITAL_COMMUNITY)
Admission: AD | Admit: 2018-09-29 | Discharge: 2018-09-29 | Disposition: A | Discharge status: Home / Self Care | Payer: Medicare Other | Source: Other Acute Inpatient Hospital | Attending: Family Medicine | Admitting: Family Medicine

## 2018-09-29 DIAGNOSIS — I10 Essential (primary) hypertension: Secondary | ICD-10-CM | POA: Diagnosis present

## 2018-09-29 DIAGNOSIS — I509 Heart failure, unspecified: Secondary | ICD-10-CM | POA: Diagnosis present

## 2018-09-29 DIAGNOSIS — J188 Other pneumonia, unspecified organism: Secondary | ICD-10-CM | POA: Diagnosis present

## 2018-09-29 LAB — COMPREHENSIVE METABOLIC PANEL
ALK PHOS: 45 U/L (ref 38–126)
ALT: 14 U/L (ref 0–44)
ANION GAP: 8 (ref 5–15)
AST: 18 U/L (ref 15–41)
Albumin: 3.2 g/dL — ABNORMAL LOW (ref 3.5–5.0)
BILIRUBIN TOTAL: 0.5 mg/dL (ref 0.3–1.2)
BUN: 37 mg/dL — ABNORMAL HIGH (ref 8–23)
CALCIUM: 8.1 mg/dL — AB (ref 8.9–10.3)
CO2: 25 mmol/L (ref 22–32)
CREATININE: 1.3 mg/dL — AB (ref 0.61–1.24)
Chloride: 103 mmol/L (ref 98–111)
GFR, EST AFRICAN AMERICAN: 53 mL/min — AB (ref >60)
GFR, EST NON AFRICAN AMERICAN: 45 mL/min — AB (ref >60)
Glucose, Bld: 177 mg/dL — ABNORMAL HIGH (ref 70–99)
Potassium: 3.7 mmol/L (ref 3.5–5.1)
Sodium: 136 mmol/L (ref 135–145)
TOTAL PROTEIN: 6.3 g/dL — AB (ref 6.5–8.1)

## 2018-09-29 LAB — CBC WITH DIFFERENTIAL/PLATELET
ABS IMMATURE GRANULOCYTES: 0.09 10*3/uL — AB (ref 0.00–0.07)
Basophils Absolute: 0 10*3/uL (ref 0.0–0.1)
Basophils Relative: 0 %
EOS PCT: 2 %
Eosinophils Absolute: 0.3 10*3/uL (ref 0.0–0.5)
HEMATOCRIT: 28.6 % — AB (ref 39.0–52.0)
HEMOGLOBIN: 8.9 g/dL — AB (ref 13.0–17.0)
Immature Granulocytes: 1 %
LYMPHS PCT: 7 %
Lymphs Abs: 1.1 10*3/uL (ref 0.7–4.0)
MCH: 29.9 pg (ref 26.0–34.0)
MCHC: 31.1 g/dL (ref 30.0–36.0)
MCV: 96 fL (ref 80.0–100.0)
MONO ABS: 0.9 10*3/uL (ref 0.1–1.0)
MONOS PCT: 6 %
NEUTROS ABS: 13.5 10*3/uL — AB (ref 1.7–7.7)
Neutrophils Relative %: 84 %
Platelets: 326 10*3/uL (ref 150–400)
RBC: 2.98 MIL/uL — ABNORMAL LOW (ref 4.22–5.81)
RDW: 14.6 % (ref 11.5–15.5)
WBC: 15.8 10*3/uL — AB (ref 4.0–10.5)
nRBC: 0 % (ref 0.0–0.2)

## 2018-12-23 ENCOUNTER — Other Ambulatory Visit: Payer: Self-pay

## 2018-12-23 ENCOUNTER — Inpatient Hospital Stay (HOSPITAL_COMMUNITY)
Admission: EM | Admit: 2018-12-23 | Discharge: 2018-12-27 | DRG: 378 | Disposition: A | Discharge status: Home Health | Payer: Medicare Other | Attending: Family Medicine | Admitting: Family Medicine

## 2018-12-23 ENCOUNTER — Encounter (HOSPITAL_COMMUNITY): Payer: Self-pay

## 2018-12-23 DIAGNOSIS — K264 Chronic or unspecified duodenal ulcer with hemorrhage: Secondary | ICD-10-CM

## 2018-12-23 DIAGNOSIS — Z79899 Other long term (current) drug therapy: Secondary | ICD-10-CM

## 2018-12-23 DIAGNOSIS — Z7902 Long term (current) use of antithrombotics/antiplatelets: Secondary | ICD-10-CM

## 2018-12-23 DIAGNOSIS — D62 Acute posthemorrhagic anemia: Secondary | ICD-10-CM | POA: Diagnosis present

## 2018-12-23 DIAGNOSIS — R1310 Dysphagia, unspecified: Secondary | ICD-10-CM | POA: Diagnosis present

## 2018-12-23 DIAGNOSIS — K922 Gastrointestinal hemorrhage, unspecified: Secondary | ICD-10-CM | POA: Diagnosis present

## 2018-12-23 DIAGNOSIS — I252 Old myocardial infarction: Secondary | ICD-10-CM

## 2018-12-23 DIAGNOSIS — I5032 Chronic diastolic (congestive) heart failure: Secondary | ICD-10-CM | POA: Diagnosis present

## 2018-12-23 DIAGNOSIS — D72829 Elevated white blood cell count, unspecified: Secondary | ICD-10-CM | POA: Diagnosis present

## 2018-12-23 DIAGNOSIS — I083 Combined rheumatic disorders of mitral, aortic and tricuspid valves: Secondary | ICD-10-CM | POA: Diagnosis present

## 2018-12-23 DIAGNOSIS — Z87442 Personal history of urinary calculi: Secondary | ICD-10-CM

## 2018-12-23 DIAGNOSIS — Z833 Family history of diabetes mellitus: Secondary | ICD-10-CM

## 2018-12-23 DIAGNOSIS — F329 Major depressive disorder, single episode, unspecified: Secondary | ICD-10-CM | POA: Diagnosis present

## 2018-12-23 DIAGNOSIS — Z888 Allergy status to other drugs, medicaments and biological substances status: Secondary | ICD-10-CM

## 2018-12-23 DIAGNOSIS — Z8546 Personal history of malignant neoplasm of prostate: Secondary | ICD-10-CM

## 2018-12-23 DIAGNOSIS — M1990 Unspecified osteoarthritis, unspecified site: Secondary | ICD-10-CM | POA: Diagnosis present

## 2018-12-23 DIAGNOSIS — K2270 Barrett's esophagus without dysplasia: Secondary | ICD-10-CM | POA: Diagnosis present

## 2018-12-23 DIAGNOSIS — Z7982 Long term (current) use of aspirin: Secondary | ICD-10-CM

## 2018-12-23 DIAGNOSIS — K449 Diaphragmatic hernia without obstruction or gangrene: Secondary | ICD-10-CM | POA: Diagnosis present

## 2018-12-23 DIAGNOSIS — F419 Anxiety disorder, unspecified: Secondary | ICD-10-CM | POA: Diagnosis present

## 2018-12-23 DIAGNOSIS — I272 Pulmonary hypertension, unspecified: Secondary | ICD-10-CM | POA: Diagnosis present

## 2018-12-23 DIAGNOSIS — I110 Hypertensive heart disease with heart failure: Secondary | ICD-10-CM | POA: Diagnosis present

## 2018-12-23 DIAGNOSIS — I808 Phlebitis and thrombophlebitis of other sites: Secondary | ICD-10-CM | POA: Diagnosis present

## 2018-12-23 DIAGNOSIS — K2970 Gastritis, unspecified, without bleeding: Secondary | ICD-10-CM | POA: Diagnosis present

## 2018-12-23 DIAGNOSIS — E119 Type 2 diabetes mellitus without complications: Secondary | ICD-10-CM | POA: Diagnosis present

## 2018-12-23 DIAGNOSIS — K219 Gastro-esophageal reflux disease without esophagitis: Secondary | ICD-10-CM | POA: Diagnosis present

## 2018-12-23 DIAGNOSIS — Z8249 Family history of ischemic heart disease and other diseases of the circulatory system: Secondary | ICD-10-CM

## 2018-12-23 DIAGNOSIS — D649 Anemia, unspecified: Secondary | ICD-10-CM

## 2018-12-23 DIAGNOSIS — I2510 Atherosclerotic heart disease of native coronary artery without angina pectoris: Secondary | ICD-10-CM | POA: Diagnosis present

## 2018-12-23 HISTORY — DX: Acute myocardial infarction, unspecified: I21.9

## 2018-12-23 LAB — URINALYSIS, ROUTINE W REFLEX MICROSCOPIC
Bilirubin Urine: NEGATIVE
Glucose, UA: NEGATIVE mg/dL
Ketones, ur: NEGATIVE mg/dL
Nitrite: NEGATIVE
Protein, ur: NEGATIVE mg/dL
Specific Gravity, Urine: 1.016 (ref 1.005–1.030)
pH: 5 (ref 5.0–8.0)

## 2018-12-23 LAB — CBC
HCT: 20 % — ABNORMAL LOW (ref 39.0–52.0)
HEMOGLOBIN: 6.2 g/dL — AB (ref 13.0–17.0)
MCH: 31 pg (ref 26.0–34.0)
MCHC: 31 g/dL (ref 30.0–36.0)
MCV: 100 fL (ref 80.0–100.0)
NRBC: 0 % (ref 0.0–0.2)
Platelets: 280 10*3/uL (ref 150–400)
RBC: 2 MIL/uL — ABNORMAL LOW (ref 4.22–5.81)
RDW: 15 % (ref 11.5–15.5)
WBC: 23.3 10*3/uL — ABNORMAL HIGH (ref 4.0–10.5)

## 2018-12-23 LAB — COMPREHENSIVE METABOLIC PANEL
ALT: 9 U/L (ref 0–44)
AST: 18 U/L (ref 15–41)
Albumin: 3.5 g/dL (ref 3.5–5.0)
Alkaline Phosphatase: 47 U/L (ref 38–126)
Anion gap: 10 (ref 5–15)
BILIRUBIN TOTAL: 0.8 mg/dL (ref 0.3–1.2)
BUN: 56 mg/dL — AB (ref 8–23)
CO2: 20 mmol/L — ABNORMAL LOW (ref 22–32)
Calcium: 8.5 mg/dL — ABNORMAL LOW (ref 8.9–10.3)
Chloride: 107 mmol/L (ref 98–111)
Creatinine, Ser: 0.88 mg/dL (ref 0.61–1.24)
Glucose, Bld: 200 mg/dL — ABNORMAL HIGH (ref 70–99)
Potassium: 3.8 mmol/L (ref 3.5–5.1)
Sodium: 137 mmol/L (ref 135–145)
TOTAL PROTEIN: 6.6 g/dL (ref 6.5–8.1)

## 2018-12-23 LAB — HEMOGLOBIN AND HEMATOCRIT, BLOOD
HEMATOCRIT: 23.1 % — AB (ref 39.0–52.0)
Hemoglobin: 7.4 g/dL — ABNORMAL LOW (ref 13.0–17.0)

## 2018-12-23 LAB — PREPARE RBC (CROSSMATCH)

## 2018-12-23 LAB — GLUCOSE, CAPILLARY: Glucose-Capillary: 146 mg/dL — ABNORMAL HIGH (ref 70–99)

## 2018-12-23 LAB — ABO/RH: ABO/RH(D): AB POS

## 2018-12-23 MED ORDER — ACETAMINOPHEN 325 MG PO TABS: 650.0000 mg | ORAL_TABLET | Freq: Four times a day (QID) | ORAL | Status: DC | PRN

## 2018-12-23 MED ORDER — BRIMONIDINE TARTRATE 0.2 % OP SOLN
1.0000 [drp] | Freq: Every day | OPHTHALMIC | Status: DC
  Administered 2018-12-23 – 2018-12-26 (×4): 1 [drp] via OPHTHALMIC
  Filled 2018-12-23: qty 5

## 2018-12-23 MED ORDER — ACETAMINOPHEN 650 MG RE SUPP: 650.0000 mg | Freq: Four times a day (QID) | RECTAL | Status: DC | PRN

## 2018-12-23 MED ORDER — PANTOPRAZOLE SODIUM 40 MG IV SOLR
40.0000 mg | Freq: Once | INTRAVENOUS | Status: AC
  Administered 2018-12-23: 40 mg via INTRAVENOUS
  Filled 2018-12-23: qty 40

## 2018-12-23 MED ORDER — POTASSIUM CHLORIDE IN NACL 20-0.9 MEQ/L-% IV SOLN
INTRAVENOUS | Status: AC
  Administered 2018-12-23: 22:00:00 via INTRAVENOUS

## 2018-12-23 MED ORDER — SODIUM CHLORIDE 0.9% IV SOLUTION
Freq: Once | INTRAVENOUS | Status: AC
  Administered 2018-12-23: 21:00:00 via INTRAVENOUS

## 2018-12-23 MED ORDER — SODIUM CHLORIDE 0.9% IV SOLUTION
Freq: Once | INTRAVENOUS | Status: AC
  Administered 2018-12-23: 22:00:00 via INTRAVENOUS

## 2018-12-23 MED ORDER — SODIUM CHLORIDE 0.9 % IV BOLUS: 500.0000 mL | Freq: Once | INTRAVENOUS | Status: DC

## 2018-12-23 MED ORDER — ATORVASTATIN CALCIUM 40 MG PO TABS
80.0000 mg | ORAL_TABLET | Freq: Every day | ORAL | Status: DC
  Administered 2018-12-23 – 2018-12-26 (×3): 80 mg via ORAL
  Filled 2018-12-23 (×4): qty 2

## 2018-12-23 MED ORDER — ONDANSETRON HCL 4 MG PO TABS: 4.0000 mg | ORAL_TABLET | Freq: Four times a day (QID) | ORAL | Status: DC | PRN

## 2018-12-23 MED ORDER — PANTOPRAZOLE SODIUM 40 MG IV SOLR
INTRAVENOUS | Status: AC
  Filled 2018-12-23: qty 80

## 2018-12-23 MED ORDER — ONDANSETRON HCL 4 MG/2ML IJ SOLN: 4.0000 mg | Freq: Four times a day (QID) | INTRAMUSCULAR | Status: DC | PRN

## 2018-12-23 MED ORDER — INSULIN ASPART 100 UNIT/ML ~~LOC~~ SOLN
0.0000 [IU] | SUBCUTANEOUS | Status: DC
  Administered 2018-12-24: 1 [IU] via SUBCUTANEOUS
  Administered 2018-12-24: 2 [IU] via SUBCUTANEOUS
  Administered 2018-12-25 – 2018-12-26 (×4): 1 [IU] via SUBCUTANEOUS

## 2018-12-23 MED ORDER — SODIUM CHLORIDE 0.9 % IV SOLN
8.0000 mg/h | INTRAVENOUS | Status: AC
  Administered 2018-12-23 – 2018-12-26 (×3): 8 mg/h via INTRAVENOUS
  Filled 2018-12-23 (×8): qty 80

## 2018-12-23 NOTE — ED Notes (Signed)
Date and time results received: 12/23/18 11:37 AM  (use smartphrase ".now" to insert current time)  Test: hgb Critical Value: 6.6  Name of Provider Notified: Dr Eulis Foster   Orders Received? Or Actions Taken?:

## 2018-12-23 NOTE — ED Notes (Signed)
ED TO INPATIENT HANDOFF REPORT  ED Nurse Name and Phone #: [place] [phone]  S Name/Age/Gender Vincent Siren Sr. 85 y.o. male Room/Bed: APA08/APA08  Code Status   Code Status: Prior  Home/SNF/Other Home Patient oriented to: self, place, time and situation Is this baseline? Yes   Triage Complete: Triage complete  Chief Complaint GI BLEED  Triage Note Pt reports no bm in "several days."  Yesterday morning pt had a bm then later in the day started having diarrhea.  Reports  Stool was black.  Home health RN told ems that pt vomited coffee ground emesis this morning.   Allergies Allergies  Allergen Reactions  . Ciprofloxacin Other (See Comments)    REACTION: Questions rx d/t feeling bad that relieved after stopping Cipro     Level of Care/Admitting Diagnosis ED Disposition    ED Disposition Condition [place]: [facility] [834196]  Level of Care: Telemetry [5]  Diagnosis: Upper GI bleed [222979]  Admitting Physician: Bethena Roys [[id number]]  Attending Physician: Bethena Roys Nessa.Cuff  PT Class (Do Not Modify): Observation [104]  PT Acc Code (Do Not Modify): Observation [10022]       B Medical/Surgery History Past Medical History:  Diagnosis Date  . Anemia   . Anxiety   . Aortic valve disorder   . Arthritis   . Barrett's esophagus 2012  . CHF (congestive heart failure) ([place])   . Coronary artery disease    mild by 2013 cath  . Depression   . Diverticulosis of colon (without mention of hemorrhage)   . Dizziness   . Dysphagia   . Dyspnea    with exertion  . GERD (gastroesophageal reflux disease)   . Hiatal hernia 2012  . Hypertension   . Kidney stones   . Murmur   . Pneumonia    " walking"  . Prostate cancer ([place])   . Rectal polyp   . Type II or unspecified type diabetes mellitus without mention of complication, not stated as uncontrolled    Past Surgical History:  Procedure Laterality Date  . ABDOMINAL  AORTOGRAM W/LOWER EXTREMITY N/A 12/06/2017   Procedure: ABDOMINAL AORTOGRAM W/LOWER EXTREMITY;  Surgeon: Conrad Adena, MD;  Location: [place] [facility];  Service: Cardiovascular;  Laterality: N/A;  . arm surgery     left  . CATARACT EXTRACTION    . CORONARY/GRAFT ACUTE MI REVASCULARIZATION N/A 04/13/2018   Procedure: Coronary/Graft Acute MI Revascularization;  Surgeon: [name], MD;  Location: [place] [facility];  Service: Cardiovascular;  Laterality: N/A;  . CORONARY/GRAFT ANGIOGRAPHY N/A 04/13/2018   Procedure: CORONARY/GRAFT ANGIOGRAPHY;  Surgeon: [name], MD;  Location: [place] [facility];  Service: Cardiovascular;  Laterality: N/A;  . ILIAC ATHERECTOMY Right 12/20/2017   Procedure: RIGHT ANTEGRADE FAILED CANNULATION OF RIGHT SUPERFICIAL FEMORAL ARTERY.;  Surgeon: Conrad [place], MD;  Location: [place];  Service: Vascular;  Laterality: Right;  . INGUINAL HERNIA REPAIR    . LEFT HEART CATHETERIZATION WITH CORONARY ANGIOGRAM N/A 07/23/2012   Procedure: LEFT HEART CATHETERIZATION WITH CORONARY ANGIOGRAM;  Surgeon: [name], MD;  Location: [place] C[facility];  Service: Cardiovascular;  Laterality: N/A;  . prostetic prostesis     x 2  . TRANSURETHRAL RESECTION OF PROSTATE       A IV Location/Drains/Wounds Patient Lines/Drains/Airways Status   Active Line/Drains/Airways    Name:   Placement date:   Placement time:   Site:   Days:   Peripheral  IV 12/23/18 Left;Posterior Forearm   12/23/18    1111    Forearm   less than 1   Peripheral IV 12/23/18 Right Forearm   12/23/18    1119    Forearm   less than 1   Incision 07/23/12 Groin Right   07/23/12    1600     2344   Incision (Closed) 12/20/17 Groin Right   12/20/17    1331     368   Wound / Incision (Open or Dehisced) 04/14/18 Diabetic ulcer Toe (Comment  which one) Right currently healing    04/14/18    0000    Toe (Comment  which one)   253          Intake/Output Last 24 hours  Intake/Output Summary (Last 24 hours) at  12/23/2018 1939 Last data filed at 12/23/2018 1824 Gross per 24 hour  Intake 743 ml  Output -  Net 743 ml    Labs/Imaging Results for orders placed or performed during the hospital encounter of 12/23/18 (from the past 48 hour(s))  Comprehensive metabolic panel     Status: Abnormal   Collection Time: 12/23/18 11:20 AM  Result Value Ref Range   Sodium 137 135 - 145 mmol/L   Potassium 3.8 3.5 - 5.1 mmol/L   Chloride 107 98 - 111 mmol/L   CO2 20 (L) 22 - 32 mmol/L   Glucose, Bld 200 (H) 70 - 99 mg/dL   BUN 56 (H) 8 - 23 mg/dL   Creatinine, Ser 0.88 0.61 - 1.24 mg/dL   Calcium 8.5 (L) 8.9 - 10.3 mg/dL   Total Protein 6.6 6.5 - 8.1 g/dL   Albumin 3.5 3.5 - 5.0 g/dL   AST 18 15 - 41 U/L   ALT 9 0 - 44 U/L   Alkaline Phosphatase 47 38 - 126 U/L   Total Bilirubin 0.8 0.3 - 1.2 mg/dL   GFR calc non Af Amer >60 >60 mL/min   GFR calc Af Amer >60 >60 mL/min   Anion gap 10 5 - 15    Comment: Performed at [facility], [address], [place], [place] [address]  CBC     Status: Abnormal   Collection Time: 12/23/18 11:20 AM  Result Value Ref Range   WBC 23.3 (H) 4.0 - 10.5 K/uL   RBC 2.00 (L) 4.22 - 5.81 MIL/uL   Hemoglobin 6.2 (LL) 13.0 - 17.0 g/dL    Comment: REPEATED TO VERIFY THIS CRITICAL RESULT HAS VERIFIED AND BEEN CALLED TO STEINHAUER,C. BY MARGARET BAUGHAM ON 03 30 2020 AT 1140, AND HAS BEEN READ BACK. CRITICAL RESULT CALLED AT 11:37 BY [facility].    HCT 20.0 (L) 39.0 - 52.0 %   MCV 100.0 80.0 - 100.0 fL   MCH 31.0 26.0 - 34.0 pg   MCHC 31.0 30.0 - 36.0 g/dL   RDW 15.0 11.5 - 15.5 %   Platelets 280 150 - 400 K/uL   nRBC 0.0 0.0 - 0.2 %    Comment: Performed at [facility], [address] [place], [place] [address]  Type and screen [facility]     Status: None (Preliminary result)   Collection Time: 12/23/18 11:20 AM  Result Value Ref Range   ABO/RH(D) AB POS    Antibody Screen NEG    Sample Expiration 12/26/2018    Unit Number [id number]    Blood Component Type RED  CELLS,LR    Unit division 00    Status of Unit ISSUED  Transfusion Status OK TO TRANSFUSE    Crossmatch Result      Compatible Performed at [facility], [address] [place], [place] [address]   ABO/Rh     Status: None   Collection Time: 12/23/18 11:20 AM  Result Value Ref Range   ABO/RH(D)      AB POS Performed at [facility], [address], [place], [place] [address]   Hemoglobin and hematocrit, blood     Status: Abnormal   Collection Time: 12/23/18  6:54 PM  Result Value Ref Range   Hemoglobin 7.4 (L) 13.0 - 17.0 g/dL   HCT 23.1 (L) 39.0 - 52.0 %    Comment: Performed at [facility], [address], [place], [place] [address]   No results found.  Pending Labs [employer] (From admission, onward)    Start     Ordered   12/23/18 1212  Prepare RBC  (Adult Blood Administration - Red Blood Cells)  Once,   R    Question Answer Comment  # of Units 2 units   Transfusion Indications Actively Bleeding / GI Bleed   If emergent release call blood bank Not emergent release      12/23/18 1212   Signed and Held  Urinalysis, Routine w reflex microscopic  Once,   R     Signed and Held   Signed and Held  CBC  Now then every 6 hours,   R     Signed and Held          Vitals/Pain Today's Vitals   12/23/18 1645 12/23/18 1700 12/23/18 1759 12/23/18 1900  BP:  (!) 152/72 138/89 137/68  Pulse: (!) 115 (!) 115 97 (!) 116  Resp: 19 19 19 15   Temp:   97.7 F (36.5 C)   TempSrc:   Oral   SpO2: 97% 97% 94% 92%  Weight:      Height:        Isolation Precautions No active isolations  Medications Medications  0.9 %  sodium chloride infusion (Manually program via Guardrails IV Fluids) (has no administration in time range)  pantoprazole (PROTONIX) 80 mg in sodium chloride 0.9 % 250 mL (0.32 mg/mL) infusion (has no administration in time range)  pantoprazole (PROTONIX) injection 40 mg (40 mg Intravenous Given 12/23/18 1854)  pantoprazole (PROTONIX) injection 40 mg (40 mg  Intravenous Given 12/23/18 1933)    Mobility walks with device High fall risk   Focused Assessments    R Recommendations: See Admitting Provider Note  Report given to:   Additional Notes: can walk to bathroom / slightly HOH / possible EGD in AM / had a peptic ulcer cauterization a few months ago

## 2018-12-23 NOTE — ED Provider Notes (Addendum)
[facility] EMERGENCY DEPARTMENT Provider Note   CSN: [id number] Arrival date & time: 12/23/18  1101    History   Chief Complaint Chief Complaint  Patient presents with  . GI Bleeding    HPI Vincent Savage. is a 85 y.o. male.     HPI  He is here for evaluation of rectal bleeding characterized by dark and "tarry," bowel movements, for several days.  He states that he had this recently, when he was treated for "ulcers," and had to have multiple transfusions.  At home this morning, he was being checked on by home health nurse, and reported that he had vomited coffee-ground emesis, so he was referred to the ED for evaluation.  Patient lives at home.  He denies headache, chest pain, shortness of breath, focal weakness or paresthesia.  There are no other known modifying factors.   Past Medical History:  Diagnosis Date  . Anemia   . Anxiety   . Aortic valve disorder   . Arthritis   . Barrett's esophagus 2012  . CHF (congestive heart failure) ([place])   . Coronary artery disease    mild by 2013 cath  . Depression   . Diverticulosis of colon (without mention of hemorrhage)   . Dizziness   . Dysphagia   . Dyspnea    with exertion  . GERD (gastroesophageal reflux disease)   . Hiatal hernia 2012  . Hypertension   . Kidney stones   . Murmur   . Pneumonia    " walking"  . Prostate cancer ([place])   . Rectal polyp   . Type II or unspecified type diabetes mellitus without mention of complication, not stated as uncontrolled     Patient Active Problem List   Diagnosis Date Noted  . STEMI (ST elevation myocardial infarction) ([place]) 04/13/2018  . ACS (acute coronary syndrome) ([place]) 04/13/2018  . ST elevation myocardial infarction involving right coronary artery ([place]) 04/13/2018  . Atherosclerosis of native arteries of the extremities with ulceration ([place]) 11/28/2017  . Esophageal reflux 03/24/2011  . Iron deficiency anemia, unspecified 03/24/2011  . Dysphagia 03/24/2011  . B12  deficiency 03/24/2011  . Increased storage iron 03/24/2011  . AS (aortic stenosis) 03/24/2011  . HEMATURIA, HX OF 11/29/2009  . BEN HTN HEART DISEASE WITHOUT HEART FAIL 10/27/2009  . AORTIC VALVE DISORDERS 10/27/2009  . OTHER DYSPNEA AND RESPIRATORY ABNORMALITIES 10/27/2009    Past Surgical History:  Procedure Laterality Date  . ABDOMINAL AORTOGRAM W/LOWER EXTREMITY N/A 12/06/2017   Procedure: ABDOMINAL AORTOGRAM W/LOWER EXTREMITY;  Surgeon: Conrad Raeford, MD;  Location: [place] [facility];  Service: Cardiovascular;  Laterality: N/A;  . arm surgery     left  . CATARACT EXTRACTION    . CORONARY/GRAFT ACUTE MI REVASCULARIZATION N/A 04/13/2018   Procedure: Coronary/Graft Acute MI Revascularization;  Surgeon: [name], MD;  Location: [place] [facility];  Service: Cardiovascular;  Laterality: N/A;  . CORONARY/GRAFT ANGIOGRAPHY N/A 04/13/2018   Procedure: CORONARY/GRAFT ANGIOGRAPHY;  Surgeon: [name], MD;  Location: [place] [facility];  Service: Cardiovascular;  Laterality: N/A;  . ILIAC ATHERECTOMY Right 12/20/2017   Procedure: RIGHT ANTEGRADE FAILED CANNULATION OF RIGHT SUPERFICIAL FEMORAL ARTERY.;  Surgeon: Conrad East Hodge, MD;  Location: [place];  Service: Vascular;  Laterality: Right;  . INGUINAL HERNIA REPAIR    . LEFT HEART CATHETERIZATION WITH CORONARY ANGIOGRAM N/A 07/23/2012   Procedure: LEFT HEART CATHETERIZATION WITH CORONARY ANGIOGRAM;  Surgeon: [name], MD;  Location: [place] [facility];  Service: Cardiovascular;  Laterality: N/A;  . prostetic prostesis     x 2  . TRANSURETHRAL RESECTION OF PROSTATE          Home Medications    Prior to Admission medications   Medication Sig Start Date End Date Taking? Authorizing Provider  acetaminophen (TYLENOL ARTHRITIS PAIN) 650 MG CR tablet Take 650 mg by mouth 2 (two) times daily.    Yes [provider]  albuterol (PROVENTIL HFA) 108 (90 Base) MCG/ACT inhaler Inhale 2 puffs into the lungs every 6 (six) hours as  needed for wheezing or shortness of breath.   Yes [provider]  atorvastatin (LIPITOR) 80 MG tablet Take 1 tablet (80 mg total) by mouth daily at 6 PM. Patient taking differently: Take 40 mg by mouth daily at 6 PM.  04/16/18  Yes Reino Bellis B, NP  brimonidine (ALPHAGAN) 0.2 % ophthalmic solution Place 1 drop into both eyes at bedtime.    Yes [provider]  cephALEXin (KEFLEX) 500 MG capsule Take 500 mg by mouth 3 (three) times daily. 11/26/18  Yes [provider]  cyanocobalamin (,VITAMIN B-12,) 1000 MCG/ML injection Patient wishes to have b12 injection done at Dr [name] office I have advised him to call and set these up since I do not know the office policy.  Inject one ML IM once a week for three weeks, then inject one ML Im once a month for one year. Then Patient needs repeat labs done to recheck his b12 level. Patient taking differently: Inject 1,000 mcg into the muscle every 3 (three) months.  02/27/11  Yes Sable Feil, MD  escitalopram (LEXAPRO) 10 MG tablet Take 10 mg by mouth at bedtime.    Yes [provider]  fluticasone (FLONASE) 50 MCG/ACT nasal spray Place 2 sprays into both nostrils daily as needed for allergies or rhinitis.   Yes [provider]  furosemide (LASIX) 20 MG tablet Take 20 mg by mouth every other day.  11/26/18  Yes [provider]  metoprolol tartrate (LOPRESSOR) 25 MG tablet Take 1 tablet (25 mg total) by mouth 2 (two) times daily. 04/16/18  Yes Cheryln Manly, NP  nitroGLYCERIN (NITROSTAT) 0.4 MG SL tablet Place 1 tablet (0.4 mg total) under the tongue every 5 (five) minutes as needed. 04/16/18  Yes Cheryln Manly, NP  ondansetron (ZOFRAN) 4 MG tablet Take 4 mg by mouth daily as needed for nausea or vomiting.  10/02/18 10/02/19 Yes [provider]  pantoprazole (PROTONIX) 40 MG tablet Take 40 mg by mouth 2 (two) times daily.  10/28/18  Yes [provider]  potassium chloride (K-DUR) 10  MEQ tablet Take 10 mEq by mouth 2 (two) times daily.  09/24/18  Yes [provider]  sucralfate (CARAFATE) 1 GM/10ML suspension Take 1 g by mouth 4 (four) times daily.  10/28/18  Yes [provider]  amLODipine (NORVASC) 10 MG tablet Take 10 mg by mouth daily.     [provider]  aspirin 81 MG tablet Take 81 mg by mouth at bedtime.     [provider]  clopidogrel (PLAVIX) 75 MG tablet Take 75 mg by mouth daily.  12/04/18 12/04/19  [provider]  latanoprost (XALATAN) 0.005 % ophthalmic solution Place 1 drop into both eyes at bedtime.    [provider]  omeprazole (PRILOSEC) 20 MG capsule Take 20 mg by mouth daily.    [provider]  ticagrelor (BRILINTA) 90 MG TABS tablet Take 1  tablet (90 mg total) by mouth 2 (two) times daily. Patient not taking: Reported on 12/23/2018 04/16/18   Cheryln Manly, NP    Family History Family History  Problem Relation Age of Onset  . Ovarian cancer Sister   . Prostate cancer Brother   . Diabetes Brother   . Heart disease Father   . Colon cancer Neg Hx     Social History Social History   Tobacco Use  . Smoking status: Never Smoker  . Smokeless tobacco: Never Used  Substance Use Topics  . Alcohol use: No  . Drug use: No     Allergies   Ciprofloxacin   Review of Systems Review of Systems  All other systems reviewed and are negative.    Physical Exam Updated Vital Signs BP (!) 132/49   Pulse (!) 112   Temp 98.2 F (36.8 C) (Oral)   Resp 17   Ht 5\' 7"  (1.702 m)   Wt 62.6 kg   SpO2 94%   BMI 21.61 kg/m   Physical Exam Vitals signs and nursing note reviewed.  Constitutional:      General: He is not in acute distress.    Appearance: He is well-developed. He is ill-appearing. He is not toxic-appearing or diaphoretic.  HENT:     Head: Normocephalic and atraumatic.     Right Ear: External ear normal.     Left Ear: External ear normal.     Nose: No congestion or  rhinorrhea.     Mouth/Throat:     Pharynx: No oropharyngeal exudate or posterior oropharyngeal erythema.  Eyes:     Conjunctiva/sclera: Conjunctivae normal.     Pupils: Pupils are equal, round, and reactive to light.  Neck:     Musculoskeletal: Normal range of motion and neck supple.     Trachea: Phonation normal.  Cardiovascular:     Rate and Rhythm: Normal rate and regular rhythm.     Heart sounds: Normal heart sounds.  Pulmonary:     Effort: Pulmonary effort is normal.     Breath sounds: Normal breath sounds.  Abdominal:     Palpations: Abdomen is soft.     Tenderness: There is no abdominal tenderness.  Genitourinary:    Comments: Normal anus.  Melanotic stool in rectal vault.  No rectal mass.  No bright red blood per rectum. Musculoskeletal: Normal range of motion.  Skin:    General: Skin is warm and dry.  Neurological:     Mental Status: He is alert and oriented to person, place, and time.     Cranial Nerves: No cranial nerve deficit.     Sensory: No sensory deficit.     Motor: No abnormal muscle tone.     Coordination: Coordination normal.  Psychiatric:        Behavior: Behavior normal.        Thought Content: Thought content normal.        Judgment: Judgment normal.      ED Treatments / Results  Labs (all labs ordered are listed, but only abnormal results are displayed) Labs Reviewed  COMPREHENSIVE METABOLIC PANEL - Abnormal; Notable for the following components:      Result Value   CO2 20 (*)    Glucose, Bld 200 (*)    BUN 56 (*)    Calcium 8.5 (*)    All other components within normal limits  CBC - Abnormal; Notable for the following components:   WBC 23.3 (*)    RBC 2.00 (*)  Hemoglobin 6.2 (*)    HCT 20.0 (*)    All other components within normal limits  POC OCCULT BLOOD, ED  TYPE AND SCREEN  ABO/RH  PREPARE RBC (CROSSMATCH)    EKG EKG Interpretation  Date/Time:  Monday December 23 2018 11:14:13 EDT Ventricular Rate:  115 PR Interval:    QRS  Duration: 142 QT Interval:  387 QTC Calculation: 536 R Axis:   -44 Text Interpretation:  Sinus tachycardia Atrial premature complex RBBB and LAFB Left ventricular hypertrophy Since last tracing rate faster Confirmed by Daleen Bo [phone]) on 12/23/2018 11:20:02 AM   Radiology No results found.  Procedures .Critical Care Performed by: Daleen Bo, MD Authorized by: Daleen Bo, MD   Critical care provider statement:    Critical care time (minutes):  35   Critical care start time:  12/23/2018 11:15 AM   Critical care end time:  12/23/2018 5:45 PM   Critical care time was exclusive of:  Separately billable procedures and treating other patients   Critical care was necessary to treat or prevent imminent or life-threatening deterioration of the following conditions:  Circulatory failure   Critical care was time spent personally by me on the following activities:  Blood draw for specimens, development of treatment plan with patient or surrogate, discussions with consultants, evaluation of patient's response to treatment, examination of patient, obtaining history from patient or surrogate, ordering and performing treatments and interventions, ordering and review of laboratory studies, pulse oximetry, re-evaluation of patient's condition, review of old charts and ordering and review of radiographic studies   (including critical care time)  Medications Ordered in ED Medications  0.9 %  sodium chloride infusion (Manually program via Guardrails IV Fluids) (has no administration in time range)     Initial Impression / Assessment and Plan / ED Course  I have reviewed the triage vital signs and the nursing notes.  Pertinent labs & imaging results that were available during my care of the patient were reviewed by me and considered in my medical decision making (see chart for details).  Clinical Course as of Dec 22 1645  Mon Dec 23, 2018  1645 Normal except white count high, hemoglobin  low  Prepare RBC [EW]  1647 Normal except CO2 low, glucose high, BUN high, calcium low  Comprehensive metabolic panel(!) [EW]    Clinical Course User Index [EW] Daleen Bo, MD        Patient Vitals for the past 24 hrs:  BP Temp Temp src Pulse Resp SpO2 Height Weight  12/23/18 1502 (!) 132/49 98.2 F (36.8 C) Oral (!) 112 17 94 % - -  12/23/18 1500 (!) 132/49 - - 99 (!) 21 97 % - -  12/23/18 1450 (!) 151/66 (!) 97.4 F (36.3 C) Oral (!) 105 (!) 26 - - -  12/23/18 1430 (!) 121/54 - - 100 15 96 % - -  12/23/18 1415 - - - 98 15 94 % - -  12/23/18 1400 129/64 - - (!) 107 17 94 % - -  12/23/18 1333 124/64 - - 81 18 95 % - -  12/23/18 1230 (!) 147/65 - - (!) 116 16 90 % - -  12/23/18 1217 131/76 - - (!) 105 18 95 % - -  12/23/18 1130 133/64 - - 99 17 94 % - -  12/23/18 1115 131/72 97.8 F (36.6 C) Oral (!) 106 16 94 % - -  12/23/18 1108 - - - - - - 5\' 7"  (1.702 m) 62.6  kg    4:47 PM Reevaluation with update and discussion. After initial assessment and treatment, an updated evaluation reveals he is comfortable.  He wants to stay overnight, for further treatment. Daleen Bo   Medical Decision Making: Rectal bleeding, likely upper, with recent stomach ulcers and duodenal ulcers.  Patient apparently had multiple EGDs, as well as treatments to stop bleeding from ulcers including cautery, clipping, and attempt at embolization.  During these treatments, in January, he received at least 7 units of packed red cells.  Patient is typically very active, driving and taking care of all of his activities of daily living.  Recurrent Upper GI bleeding. He is hemodynamically stable.  He is not on anticoagulants.  White blood cell count trending up over the last week, most likely is reactive.  The patient has no signs or symptoms of acute infection    CRITICAL CARE- yes Performed by: Daleen Bo  Nursing Notes Reviewed/ Care Coordinated Applicable Imaging Reviewed Interpretation of  Laboratory Data incorporated into ED treatment   5:41 PM-Consult complete with Hospitalist. Patient case explained and discussed.  She agrees to admit patient for further evaluation and treatment. Call ended at 5:56 PM  Plan: Admit  Final Clinical Impressions(s) / ED Diagnoses   Final diagnoses:  Upper GI bleed    ED Discharge Orders    None       Daleen Bo, MD 12/23/18 Joanie Coddington, MD 02/01/19 1027

## 2018-12-23 NOTE — H&P (Addendum)
History and Physical    Vincent Savage OQH:[id number] DOB: Oct 22, 1917 DOA: 12/23/2018  PCP: Dione Housekeeper, MD   Patient coming from: Home  I have personally briefly reviewed patient's old medical records in [place]  Chief Complaint: Black stools, vomiting  HPI: Vincent Savage. is a 85 y.o. male with medical history significant for GI bleed, STEMI 03/2018 , who presented with c/o several days of black stools, patient's home health RN also reported coffee-ground emesis today.  He denies abdominal pain.  No chest pain. He supposed to be on antiplatelet for STEMI.  He tells me his Brilinta was changed to Plavix and subsequently changed to another medication he is unaware of the name.  He tells me he did not take his medications yesterday.  Per care everywhere, patient was admitted at [facility] for GI bleed.  He had EGD 1/24 and 1/29.  EGD showed multiple gastric and duodenal bulb ulcers, with visible vessel, requiring epi injection, hemoclipping x 3.  Patient's was also evaluated by IR for embolization but they were unable to embolize due to aberrant anatomy.  At that admission patient required 7 units of PRBC. Patient subsequently followed up with his cardiologist 12/04/18, for some reason patient had not been taking his Brilinta.  He is dual antiplatelet aspirin and Brilinta with changed from Plavix.  He reports some mild lower abdominal pain, denies burning with urination.  Patient was also prescribed Keflex for an abscess on his back 3/3.  No neck pain.  Reports he has been home, no sick contacts, no travel, no nasal congestion sore throat or myalgias, no cough or difficulty breathing.  Patient at baseline despite his age has good functional status, and drives.  ED Course: Intermittent tachycardia to 118, blood pressure systolic 546F to 681E.  Hgb 6.2, with normal platelets 280. significant leukocytosis 23.  BUN elevated 56.  Patient given 1 unit of blood in ED.  Second unit  of blood was declined as there is scarcity of blood transfusion products, this patient's hemoglobin was less than 7.  Hospitalist to admit for GI bleed.  Review of Systems: As per HPI all other systems reviewed and negative.  Past Medical History:  Diagnosis Date  . Anemia   . Anxiety   . Aortic valve disorder   . Arthritis   . Barrett's esophagus 2012  . CHF (congestive heart failure) ([place])   . Coronary artery disease    mild by 2013 cath  . Depression   . Diverticulosis of colon (without mention of hemorrhage)   . Dizziness   . Dysphagia   . Dyspnea    with exertion  . GERD (gastroesophageal reflux disease)   . Hiatal hernia 2012  . Hypertension   . Kidney stones   . Murmur   . Pneumonia    " walking"  . Prostate cancer ([place])   . Rectal polyp   . Type II or unspecified type diabetes mellitus without mention of complication, not stated as uncontrolled     Past Surgical History:  Procedure Laterality Date  . ABDOMINAL AORTOGRAM W/LOWER EXTREMITY N/A 12/06/2017   Procedure: ABDOMINAL AORTOGRAM W/LOWER EXTREMITY;  Surgeon: Conrad Spencer, MD;  Location: [place] [facility];  Service: Cardiovascular;  Laterality: N/A;  . arm surgery     left  . CATARACT EXTRACTION    . CORONARY/GRAFT ACUTE MI REVASCULARIZATION N/A 04/13/2018   Procedure: Coronary/Graft Acute MI Revascularization;  Surgeon: [name], MD;  Location: [place]  CV [facility];  Service: Cardiovascular;  Laterality: N/A;  . CORONARY/GRAFT ANGIOGRAPHY N/A 04/13/2018   Procedure: CORONARY/GRAFT ANGIOGRAPHY;  Surgeon: [name], MD;  Location: [place] [facility];  Service: Cardiovascular;  Laterality: N/A;  . ILIAC ATHERECTOMY Right 12/20/2017   Procedure: RIGHT ANTEGRADE FAILED CANNULATION OF RIGHT SUPERFICIAL FEMORAL ARTERY.;  Surgeon: Conrad [place], MD;  Location: [place];  Service: Vascular;  Laterality: Right;  . INGUINAL HERNIA REPAIR    . LEFT HEART CATHETERIZATION WITH CORONARY ANGIOGRAM N/A 07/23/2012    Procedure: LEFT HEART CATHETERIZATION WITH CORONARY ANGIOGRAM;  Surgeon: [name], MD;  Location: [place] CATH LAB;  Service: Cardiovascular;  Laterality: N/A;  . prostetic prostesis     x 2  . TRANSURETHRAL RESECTION OF PROSTATE       reports that he has never smoked. He has never used smokeless tobacco. He reports that he does not drink alcohol or use drugs.  Allergies  Allergen Reactions  . Ciprofloxacin Other (See Comments)    REACTION: Questions rx d/t feeling bad that relieved after stopping Cipro     Family History  Problem Relation Age of Onset  . Ovarian cancer Sister   . Prostate cancer Brother   . Diabetes Brother   . Heart disease Father   . Colon cancer Neg Hx     Prior to Admission medications   Medication Sig Start Date End Date Taking? Authorizing Provider  acetaminophen (TYLENOL ARTHRITIS PAIN) 650 MG CR tablet Take 650 mg by mouth 2 (two) times daily.    Yes [provider]  albuterol (PROVENTIL HFA) 108 (90 Base) MCG/ACT inhaler Inhale 2 puffs into the lungs every 6 (six) hours as needed for wheezing or shortness of breath.   Yes [provider]  atorvastatin (LIPITOR) 80 MG tablet Take 1 tablet (80 mg total) by mouth daily at 6 PM. Patient taking differently: Take 40 mg by mouth daily at 6 PM.  04/16/18  Yes Reino Bellis B, NP  brimonidine (ALPHAGAN) 0.2 % ophthalmic solution Place 1 drop into both eyes at bedtime.    Yes [provider]  cephALEXin (KEFLEX) 500 MG capsule Take 500 mg by mouth 3 (three) times daily. 11/26/18  Yes [provider]  cyanocobalamin (,VITAMIN B-12,) 1000 MCG/ML injection Patient wishes to have b12 injection done at Dr [name] office I have advised him to call and set these up since I do not know the office policy.  Inject one ML IM once a week for three weeks, then inject one ML Im once a month for one year. Then Patient needs repeat labs done to recheck his b12 level. Patient taking differently:  Inject 1,000 mcg into the muscle every 3 (three) months.  02/27/11  Yes Sable Feil, MD  escitalopram (LEXAPRO) 10 MG tablet Take 10 mg by mouth at bedtime.    Yes [provider]  fluticasone (FLONASE) 50 MCG/ACT nasal spray Place 2 sprays into both nostrils daily as needed for allergies or rhinitis.   Yes [provider]  furosemide (LASIX) 20 MG tablet Take 20 mg by mouth every other day.  11/26/18  Yes [provider]  metoprolol tartrate (LOPRESSOR) 25 MG tablet Take 1 tablet (25 mg total) by mouth 2 (two) times daily. 04/16/18  Yes Cheryln Manly, NP  nitroGLYCERIN (NITROSTAT) 0.4 MG SL tablet Place 1 tablet (0.4 mg total) under the tongue every 5 (five) minutes as needed. 04/16/18  Yes Cheryln Manly, NP  ondansetron [facility]) 4  MG tablet Take 4 mg by mouth daily as needed for nausea or vomiting.  10/02/18 10/02/19 Yes [provider]  pantoprazole (PROTONIX) 40 MG tablet Take 40 mg by mouth 2 (two) times daily.  10/28/18  Yes [provider]  potassium chloride (K-DUR) 10 MEQ tablet Take 10 mEq by mouth 2 (two) times daily.  09/24/18  Yes [provider]  sucralfate (CARAFATE) 1 GM/10ML suspension Take 1 g by mouth 4 (four) times daily.  10/28/18  Yes [provider]  amLODipine (NORVASC) 10 MG tablet Take 10 mg by mouth daily.     [provider]  aspirin 81 MG tablet Take 81 mg by mouth at bedtime.     [provider]  clopidogrel (PLAVIX) 75 MG tablet Take 75 mg by mouth daily.  12/04/18 12/04/19  [provider]  latanoprost (XALATAN) 0.005 % ophthalmic solution Place 1 drop into both eyes at bedtime.    [provider]  omeprazole (PRILOSEC) 20 MG capsule Take 20 mg by mouth daily.    [provider]  ticagrelor (BRILINTA) 90 MG TABS tablet Take 1 tablet (90 mg total) by mouth 2 (two) times daily. Patient not taking: Reported on 12/23/2018 04/16/18   Cheryln Manly, NP     Physical Exam: Vitals:   12/23/18 1645 12/23/18 1700 12/23/18 1759 12/23/18 1900  BP:  (!) 152/72 138/89 137/68  Pulse: (!) 115 (!) 115 97 (!) 116  Resp: 19 19 19 15   Temp:   97.7 F (36.5 C)   TempSrc:   Oral   SpO2: 97% 97% 94% 92%  Weight:      Height:        Constitutional: NAD, calm, comfortable Vitals:   12/23/18 1645 12/23/18 1700 12/23/18 1759 12/23/18 1900  BP:  (!) 152/72 138/89 137/68  Pulse: (!) 115 (!) 115 97 (!) 116  Resp: 19 19 19 15   Temp:   97.7 F (36.5 C)   TempSrc:   Oral   SpO2: 97% 97% 94% 92%  Weight:      Height:       Eyes: PERRL, lids and conjunctivae normal ENMT: Mucous membranes are moist. Posterior pharynx clear of any exudate or lesionsNeck: normal, supple, no masses, no thyromegaly Respiratory: Normal respiratory effort. No accessory muscle use.  Cardiovascular: Regular rate and rhythm,No extremity edema. 2+ pedal pulses.  Abdomen: no tenderness, no masses palpated. No hepatosplenomegaly. Bowel sounds positive.  Musculoskeletal: no clubbing / cyanosis. No joint deformity upper and lower extremities. Good ROM, no contractures. Normal muscle tone.  Skin: no rashes, lesions, ulcers. No induration Neurologic: CN 2-12 grossly intact.  Strength 5/5 in all 4.  Psychiatric: Normal judgment and insight. Alert and oriented x 3. Normal mood.   Labs on Admission: I have personally reviewed following labs and imaging studies  CBC: Recent Labs  Lab 12/23/18 1120 12/23/18 1854  WBC 23.3*  --   HGB 6.2* 7.4*  HCT 20.0* 23.1*  MCV 100.0  --   PLT 280  --    Basic Metabolic Panel: Recent Labs  Lab 12/23/18 1120  NA 137  K 3.8  CL 107  CO2 20*  GLUCOSE 200*  BUN 56*  CREATININE 0.88  CALCIUM 8.5*   Liver Function Tests: Recent Labs  Lab 12/23/18 1120  AST 18  ALT 9  ALKPHOS 47  BILITOT 0.8  PROT 6.6  ALBUMIN 3.5   Urine analysis:    Component Value Date/Time   COLORURINE YELLOW 12/20/2017  [place] 12/20/2017  1109   LABSPEC 1.016 12/20/2017 1109   PHURINE 5.0 12/20/2017 1109   GLUCOSEU NEGATIVE 12/20/2017 1109   [place] 12/20/2017 1109   BILIRUBINUR NEGATIVE 12/20/2017 1109   KETONESUR NEGATIVE 12/20/2017 1109   PROTEINUR NEGATIVE 12/20/2017 1109   UROBILINOGEN 0.2 07/27/2012 1633   NITRITE NEGATIVE 12/20/2017 1109   LEUKOCYTESUR NEGATIVE 12/20/2017 1109    Radiological Exams on Admission: No results found.  EKG: Independently reviewed.  EKG shows sinus tachycardia, LAFB, old RBBB.  Repeat EKG read as wide-complex tachycardia.   Assessment/Plan Active Problems:   Upper GI bleed  Upper GI bleed- Hgb 6.2, recent Hgb 11/04/18- 10. History of multiple gastric and duodenal ulcer.  On antiplatelet therapy for STEMI, recently changed from aspirin and Brilinta to Plavix. 1 unit transfused in the ED. posttransfusion H&H 7.4 -Considering age and recent NSTEMI goal hemoglobin > 8 - Talked to blood bank patient will be transfused a second unit of blood - GI consult- Talked to Dr. Gala Romney, he will see in A.m - N.p.o midnight -Protonix Bolus and drip - CBC Q6H X 3 - N/s + 20KCL 50cc/hr x 15 hrs  STEMI- 03/2018.  DES to ostial RCA. Dual antiplatelet commended for 12 months, but switched to Plavix by cardiologist at [place]- (Dr. Radford Pax) with onset of GI bleed.  -Hold antiplatelet - Cont statin  Leukocytosis- 23.  Quite elevated to attribute to just stress reaction.  Reports lower abdominal pain but exam is benign , otherwise no symptoms or exam findings at this time to suggest infectious etiology.  Recent abscess on his back, which Keflex was prescribed has resolved. -Check UA - Trend WBC  HTN- stable. -Hold home Norvasc, metoprolol in the setting of GI bleed  Severe aortic stenosis-plans to proceed with work-up for transcatheter aortic valve replacement per cardiologist note.  Prolonged QTC 536- K- 3.8. - Check mag - EKG a.m - Hold lexapro  DVT prophylaxis: SCDS Code Status:  FULL Family Communication: None at bedside Disposition Plan: Per rounding team Consults called: GI Admission status: Obs, tele  Bethena Roys MD [facility]  12/23/2018, 7:25 PM

## 2018-12-23 NOTE — ED Notes (Signed)
Have notified Lab to call me once blood is ready

## 2018-12-23 NOTE — ED Triage Notes (Signed)
Pt reports no bm in "several days."  Yesterday morning pt had a bm then later in the day started having diarrhea.  Reports  Stool was black.  Home health RN told ems that pt vomited coffee ground emesis this morning.

## 2018-12-23 NOTE — ED Notes (Signed)
Pt's first unit of blood started in the right forearm.

## 2018-12-24 ENCOUNTER — Observation Stay (HOSPITAL_COMMUNITY): Payer: Medicare Other | Admitting: Anesthesiology

## 2018-12-24 ENCOUNTER — Other Ambulatory Visit: Payer: Self-pay

## 2018-12-24 ENCOUNTER — Encounter (HOSPITAL_COMMUNITY): Payer: Self-pay | Admitting: Gastroenterology

## 2018-12-24 ENCOUNTER — Encounter (HOSPITAL_COMMUNITY)
Admission: EM | Disposition: A | Discharge status: Home Health | Payer: Self-pay | Source: Home / Self Care | Attending: Family Medicine

## 2018-12-24 DIAGNOSIS — I083 Combined rheumatic disorders of mitral, aortic and tricuspid valves: Secondary | ICD-10-CM | POA: Diagnosis not present

## 2018-12-24 DIAGNOSIS — I10 Essential (primary) hypertension: Secondary | ICD-10-CM | POA: Diagnosis not present

## 2018-12-24 DIAGNOSIS — I272 Pulmonary hypertension, unspecified: Secondary | ICD-10-CM | POA: Diagnosis not present

## 2018-12-24 DIAGNOSIS — D72829 Elevated white blood cell count, unspecified: Secondary | ICD-10-CM | POA: Diagnosis not present

## 2018-12-24 DIAGNOSIS — I808 Phlebitis and thrombophlebitis of other sites: Secondary | ICD-10-CM | POA: Diagnosis present

## 2018-12-24 DIAGNOSIS — K219 Gastro-esophageal reflux disease without esophagitis: Secondary | ICD-10-CM | POA: Diagnosis present

## 2018-12-24 DIAGNOSIS — D649 Anemia, unspecified: Secondary | ICD-10-CM | POA: Diagnosis not present

## 2018-12-24 DIAGNOSIS — F329 Major depressive disorder, single episode, unspecified: Secondary | ICD-10-CM | POA: Diagnosis present

## 2018-12-24 DIAGNOSIS — R319 Hematuria, unspecified: Secondary | ICD-10-CM

## 2018-12-24 DIAGNOSIS — Z833 Family history of diabetes mellitus: Secondary | ICD-10-CM | POA: Diagnosis not present

## 2018-12-24 DIAGNOSIS — K449 Diaphragmatic hernia without obstruction or gangrene: Secondary | ICD-10-CM | POA: Diagnosis not present

## 2018-12-24 DIAGNOSIS — E119 Type 2 diabetes mellitus without complications: Secondary | ICD-10-CM | POA: Diagnosis present

## 2018-12-24 DIAGNOSIS — K2270 Barrett's esophagus without dysplasia: Secondary | ICD-10-CM | POA: Diagnosis not present

## 2018-12-24 DIAGNOSIS — Z7982 Long term (current) use of aspirin: Secondary | ICD-10-CM | POA: Diagnosis not present

## 2018-12-24 DIAGNOSIS — K922 Gastrointestinal hemorrhage, unspecified: Secondary | ICD-10-CM | POA: Diagnosis present

## 2018-12-24 DIAGNOSIS — K2970 Gastritis, unspecified, without bleeding: Secondary | ICD-10-CM | POA: Diagnosis present

## 2018-12-24 DIAGNOSIS — Z8546 Personal history of malignant neoplasm of prostate: Secondary | ICD-10-CM | POA: Diagnosis not present

## 2018-12-24 DIAGNOSIS — M1990 Unspecified osteoarthritis, unspecified site: Secondary | ICD-10-CM | POA: Diagnosis not present

## 2018-12-24 DIAGNOSIS — I110 Hypertensive heart disease with heart failure: Secondary | ICD-10-CM | POA: Diagnosis not present

## 2018-12-24 DIAGNOSIS — I25810 Atherosclerosis of coronary artery bypass graft(s) without angina pectoris: Secondary | ICD-10-CM | POA: Diagnosis not present

## 2018-12-24 DIAGNOSIS — D500 Iron deficiency anemia secondary to blood loss (chronic): Secondary | ICD-10-CM | POA: Diagnosis not present

## 2018-12-24 DIAGNOSIS — F419 Anxiety disorder, unspecified: Secondary | ICD-10-CM | POA: Diagnosis not present

## 2018-12-24 DIAGNOSIS — I252 Old myocardial infarction: Secondary | ICD-10-CM | POA: Diagnosis not present

## 2018-12-24 DIAGNOSIS — R1310 Dysphagia, unspecified: Secondary | ICD-10-CM | POA: Diagnosis present

## 2018-12-24 DIAGNOSIS — I2510 Atherosclerotic heart disease of native coronary artery without angina pectoris: Secondary | ICD-10-CM | POA: Diagnosis not present

## 2018-12-24 DIAGNOSIS — D62 Acute posthemorrhagic anemia: Secondary | ICD-10-CM | POA: Diagnosis not present

## 2018-12-24 DIAGNOSIS — Z79899 Other long term (current) drug therapy: Secondary | ICD-10-CM | POA: Diagnosis not present

## 2018-12-24 DIAGNOSIS — Z87442 Personal history of urinary calculi: Secondary | ICD-10-CM | POA: Diagnosis not present

## 2018-12-24 DIAGNOSIS — K264 Chronic or unspecified duodenal ulcer with hemorrhage: Secondary | ICD-10-CM | POA: Diagnosis not present

## 2018-12-24 DIAGNOSIS — I5032 Chronic diastolic (congestive) heart failure: Secondary | ICD-10-CM | POA: Diagnosis not present

## 2018-12-24 HISTORY — PX: ESOPHAGOGASTRODUODENOSCOPY (EGD) WITH PROPOFOL: SHX5813

## 2018-12-24 HISTORY — PX: BIOPSY: SHX5522

## 2018-12-24 LAB — GLUCOSE, CAPILLARY
GLUCOSE-CAPILLARY: 143 mg/dL — AB (ref 70–99)
Glucose-Capillary: 113 mg/dL — ABNORMAL HIGH (ref 70–99)
Glucose-Capillary: 115 mg/dL — ABNORMAL HIGH (ref 70–99)
Glucose-Capillary: 128 mg/dL — ABNORMAL HIGH (ref 70–99)
Glucose-Capillary: 129 mg/dL — ABNORMAL HIGH (ref 70–99)
Glucose-Capillary: 174 mg/dL — ABNORMAL HIGH (ref 70–99)
Glucose-Capillary: 184 mg/dL — ABNORMAL HIGH (ref 70–99)

## 2018-12-24 LAB — DIFFERENTIAL
Basophils Absolute: 0 10*3/uL (ref 0.0–0.1)
Basophils Relative: 0 %
EOS PCT: 0 %
Eosinophils Absolute: 0 10*3/uL (ref 0.0–0.5)
LYMPHS PCT: 12 %
Lymphs Abs: 2.2 10*3/uL (ref 0.7–4.0)
MONO ABS: 1.3 10*3/uL — AB (ref 0.1–1.0)
Monocytes Relative: 7 %
NEUTROS ABS: 14.2 10*3/uL — AB (ref 1.7–7.7)
Neutrophils Relative %: 79 %

## 2018-12-24 LAB — CBC
HCT: 23 % — ABNORMAL LOW (ref 39.0–52.0)
Hemoglobin: 7.4 g/dL — ABNORMAL LOW (ref 13.0–17.0)
MCH: 31.1 pg (ref 26.0–34.0)
MCHC: 32.2 g/dL (ref 30.0–36.0)
MCV: 96.6 fL (ref 80.0–100.0)
Platelets: 193 10*3/uL (ref 150–400)
RBC: 2.38 MIL/uL — ABNORMAL LOW (ref 4.22–5.81)
RDW: 15.1 % (ref 11.5–15.5)
WBC: 18 10*3/uL — ABNORMAL HIGH (ref 4.0–10.5)
nRBC: 0.1 % (ref 0.0–0.2)

## 2018-12-24 LAB — MAGNESIUM: Magnesium: 1.7 mg/dL (ref 1.7–2.4)

## 2018-12-24 SURGERY — ESOPHAGOGASTRODUODENOSCOPY (EGD) WITH PROPOFOL
Anesthesia: Monitor Anesthesia Care

## 2018-12-24 MED ORDER — HYDROMORPHONE HCL 1 MG/ML IJ SOLN: 0.2500 mg | INTRAMUSCULAR | Status: DC | PRN

## 2018-12-24 MED ORDER — KETAMINE HCL 50 MG/5ML IJ SOSY
PREFILLED_SYRINGE | INTRAMUSCULAR | Status: AC
  Filled 2018-12-24: qty 5

## 2018-12-24 MED ORDER — SODIUM CHLORIDE (PF) 0.9 % IJ SOLN
PREFILLED_SYRINGE | INTRAMUSCULAR | Status: DC | PRN
  Administered 2018-12-24: 4 mL

## 2018-12-24 MED ORDER — LIDOCAINE VISCOUS HCL 2 % MT SOLN
OROMUCOSAL | Status: DC | PRN
  Administered 2018-12-24: 1 via OROMUCOSAL

## 2018-12-24 MED ORDER — KETAMINE HCL 10 MG/ML IJ SOLN
INTRAMUSCULAR | Status: DC | PRN
  Administered 2018-12-24 (×2): 10 mg via INTRAVENOUS
  Administered 2018-12-24: 5 mg via INTRAVENOUS

## 2018-12-24 MED ORDER — EPINEPHRINE PF 1 MG/10ML IJ SOSY
PREFILLED_SYRINGE | INTRAMUSCULAR | Status: AC
  Filled 2018-12-24: qty 10

## 2018-12-24 MED ORDER — PROMETHAZINE HCL 25 MG/ML IJ SOLN: 6.2500 mg | INTRAMUSCULAR | Status: DC | PRN

## 2018-12-24 MED ORDER — SODIUM CHLORIDE 0.9 % IV SOLN: INTRAVENOUS | Status: DC

## 2018-12-24 MED ORDER — METOPROLOL TARTRATE 25 MG PO TABS
12.5000 mg | ORAL_TABLET | Freq: Two times a day (BID) | ORAL | Status: DC
  Administered 2018-12-24 – 2018-12-27 (×6): 12.5 mg via ORAL
  Filled 2018-12-24 (×6): qty 1

## 2018-12-24 MED ORDER — MIDAZOLAM HCL 2 MG/2ML IJ SOLN
INTRAMUSCULAR | Status: AC
  Filled 2018-12-24: qty 2

## 2018-12-24 MED ORDER — PANTOPRAZOLE SODIUM 40 MG IV SOLR
INTRAVENOUS | Status: AC
  Filled 2018-12-24: qty 80

## 2018-12-24 MED ORDER — HYDROCODONE-ACETAMINOPHEN 7.5-325 MG PO TABS: 1.0000 | ORAL_TABLET | Freq: Once | ORAL | Status: DC | PRN

## 2018-12-24 MED ORDER — LIDOCAINE VISCOUS HCL 2 % MT SOLN
5.0000 mL | Freq: Once | OROMUCOSAL | Status: DC
  Filled 2018-12-24: qty 15

## 2018-12-24 MED ORDER — METOPROLOL TARTRATE 5 MG/5ML IV SOLN
INTRAVENOUS | Status: DC | PRN
  Administered 2018-12-24: 8 mg via INTRAVENOUS

## 2018-12-24 MED ORDER — LACTATED RINGERS IV SOLN
INTRAVENOUS | Status: DC
  Administered 2018-12-24: 11:00:00 via INTRAVENOUS

## 2018-12-24 MED ORDER — ARTIFICIAL TEARS OPHTHALMIC OINT
TOPICAL_OINTMENT | OPHTHALMIC | Status: AC
  Filled 2018-12-24: qty 3.5

## 2018-12-24 MED ORDER — LIDOCAINE VISCOUS HCL 2 % MT SOLN
OROMUCOSAL | Status: AC
  Filled 2018-12-24: qty 15

## 2018-12-24 MED ORDER — LACTATED RINGERS IV SOLN: INTRAVENOUS | Status: DC

## 2018-12-24 MED ORDER — MIDAZOLAM HCL 5 MG/5ML IJ SOLN
INTRAMUSCULAR | Status: DC | PRN
  Administered 2018-12-24: 0.5 mg via INTRAVENOUS

## 2018-12-24 MED ORDER — MEPERIDINE HCL 100 MG/ML IJ SOLN: 6.2500 mg | INTRAMUSCULAR | Status: DC | PRN

## 2018-12-24 NOTE — Progress Notes (Addendum)
PROGRESS NOTE    Vincent Savage  JHE:[id number]  DOB: 04/26/1918  DOA: 12/23/2018 PCP: Dione Housekeeper, MD   Brief Admission Hx: 85 year old gentleman status post STEMI and 04/13/2018 presented with recurrent GI bleeding.  He had recently been seen at [facility] for a similar GI bleed and noted to have multiple gastric ulcers and duodenal ulcers treated with epi injection and hemoclipping x3.  MDM/Assessment & Plan:   1. Upper GI bleeding- hemoglobin is up to 7.4 status post transfusion of packed red blood cells.  The patient reports no bowel movement since admission.  He is feeling somewhat better after the transfusion.  GI is planning to see him this morning.  He has been kept on a Protonix infusion and he is n.p.o. pending GI evaluation. 2. Coronary artery disease status post STEMI 04/13/2018-DES to ostial RCA.  Dual antiplatelet with recommended initially for 12 months but has subsequently been switched to Plavix by his cardiologist at [facility] health due to GI bleeding.  His Plavix is currently being held.  His statin has been continued.  3. Leukocytosis- WBC trending down.  No signs of infection found.  Likely secondary to GI bleeding.  His urinalysis evident this abnormal and will be sent for culture. 4. Essential hypertension- his amlodipine currently being held in the setting of active GI bleeding. 5. Severe aortic stenosis status is cardiology had been making plans to proceed with a work-up for transcatheter aortic valve replacement.   DVT prophylaxis: SCDs Code Status: Full Family Communication: Patient Disposition Plan: Pending subspecialty consultation and work-up   Consultants:  GI  Procedures:  N/A  Antimicrobials:  N/A  Subjective: Patient reports that he has not had a bowel movement since admission.  He does feel less weak after transfusion.  Objective: Vitals:   12/23/18 2210 12/23/18 2230 12/23/18 2254 12/24/18 0132  BP: 135/71 118/70 (!) 121/55 (!)  102/53  Pulse: (!) 114 (!) 101 90 82  Resp: 17 17 17 17   Temp: 98.2 F (36.8 C) 98.2 F (36.8 C) 98.5 F (36.9 C) 98.2 F (36.8 C)  TempSrc: Oral Oral Oral Oral  SpO2: 94% 93% 95% 99%  Weight:      Height:        Intake/Output Summary (Last 24 hours) at 12/24/2018 1856 Last data filed at 12/24/2018 0300 Gross per 24 hour  Intake 1433.06 ml  Output -  Net 1433.06 ml   Filed Weights   12/23/18 1108  Weight: 62.6 kg     REVIEW OF SYSTEMS  As per history otherwise all reviewed and reported negative  Exam:  General exam: Elderly male, awake and alert in no apparent distress, cooperative.  Dry mucous membranes. Respiratory system: Clear. No increased work of breathing. Cardiovascular system: S1 & S2 heard.  Harsh systolic murmur. Gastrointestinal system: Abdomen is nondistended, soft and nontender. Normal bowel sounds heard. Central nervous system: Alert and oriented. No focal neurological deficits. Extremities: no CCE.  Data Reviewed: Basic Metabolic Panel: Recent Labs  Lab 12/23/18 1120 12/24/18 0159  NA 137  --   K 3.8  --   CL 107  --   CO2 20*  --   GLUCOSE 200*  --   BUN 56*  --   CREATININE 0.88  --   CALCIUM 8.5*  --   MG  --  1.7   Liver Function Tests: Recent Labs  Lab 12/23/18 1120  AST 18  ALT 9  ALKPHOS 47  BILITOT 0.8  PROT 6.6  ALBUMIN  3.5   No results for input(s): LIPASE, AMYLASE in the last 168 hours. No results for input(s): AMMONIA in the last 168 hours. CBC: Recent Labs  Lab 12/23/18 1120 12/23/18 1854 12/24/18 0159  WBC 23.3*  --  18.0*  NEUTROABS  --   --  14.2*  HGB 6.2* 7.4* 7.4*  HCT 20.0* 23.1* 23.0*  MCV 100.0  --  96.6  PLT 280  --  193   Cardiac Enzymes: No results for input(s): CKTOTAL, CKMB, CKMBINDEX, TROPONINI in the last 168 hours. CBG (last 3)  Recent Labs    12/23/18 2311 12/24/18 0400 12/24/18 0720  GLUCAP 146* 128* 129*   No results found for this or any previous visit (from the past 240  hour(s)).   Studies: No results found.  Scheduled Meds: . atorvastatin  80 mg Oral q1800  . brimonidine  1 drop Both Eyes QHS  . insulin aspart  0-9 Units Subcutaneous Q4H   Continuous Infusions: . 0.9 % NaCl with KCl 20 mEq / L 50 mL/hr at 12/24/18 0300  . pantoprozole (PROTONIX) infusion 8 mg/hr (12/24/18 0559)    Active Problems:   Upper GI bleed  Time spent:   Irwin Brakeman, MD [facility] 12/24/2018, 8:33 AM    LOS: 0 days  How to contact the [facility] Attending or Consulting provider [place] or covering provider during after hours [place], for this patient?  1. Check the care team in [facility] and look for a) attending/consulting [facility] provider listed and b) the [facility] team listed 2. Log into [email or web address] and use [place]'s universal password to access. If you do not have the password, please contact the hospital operator. 3. Locate the [facility] provider you are looking for under [facility] and page to a number that you can be directly reached. 4. If you still have difficulty reaching the provider, please page the [facility] (Director on Call) for the Hospitalists listed on amion for assistance.

## 2018-12-24 NOTE — Plan of Care (Signed)
Discussed findings and SUBSEQUENT MANAGEMENT WITH DR. Wynetta Emery AND PT'S SON: Vincent Savage INCLUDING THE FOLLOWING RECOMMENDATIONS: 1. AVOID ASA INDEFINITELY UNLESS MEDICALLY NECESSARY. CONSIDER PLAVIX WHICH IS IS NOT ASSOCIATED WITH PUD, AND 2. REPEAT EGD IF NEEDED BECAUSE PT IS NOT A CANDIDATE FOR EMBOLIZATION AND EGD IS MORE LOW RISK THAN SURGERY. THEY VOICED THEIR UNDERSTANDING.

## 2018-12-24 NOTE — Consult Note (Signed)
Referring Provider: Dr. Denton Brick Primary Care Physician:  Dione Housekeeper, MD Primary Gastroenterologist:  [facility] in 2014, most recently inpatient [facility]  Date of Admission: 12/23/2018 Date of Consultation: 12/24/2018  Reason for Consultation:  UGI bleed, history of multiple ulcers, previously requiring bleeding control therapy Jan 2020 at outside hospital  HPI:  Vincent Savage. is a 85 y.o. year old male presenting to the ED yesterday with reports of  melena for several days and episode of coffee-ground emesis. Admitting Hgb 6.2. Last documented Hgb from outside records 10. Received 2 units PRBCs this admission and now 7.4. Inpatient Jan 2020 at [facility] with UGI bleed in setting of Brilinta s/p EGD 10/18/2018 with multiple small and large cratered ulcer in antrum and duodenal bulb, single small cratered ulcer in duodenal bulb with visible vessel s/p epi and clip placement X 3 and cauterization (located distal duodenal bulb just prior to duodenal sweep). IR intervention felt too high risk due to variant anatomy. 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 No collateral pathway to GDA identified.  Repeat EGD 1/29 due to persistent bleeding with clean based ulcers through antrum and friable ulcer edges oozing s/p cautery, multiple ulcers in duodenal bulb oozing s/p cautery and epi. Received 7 units PRBCs that admission. He was restarted on Brilinta due to history of STEMI last year with stenting.  Also found to have left brachail vein DVT with superficial thrombophlebitis but anticoagulation not recommended in this setting. Cardiologist note from December 04, 2018 that he had not been on Brilinta for unknown reasons and was changed to Plavix. Discussion at that visit for transcatheter aortic valve replacement in future. Pertinent cardiac history: severe aortic stenosis, moderately severe to severe mitral regurgitation and tricuspid regurgitation, moderately severe  pulmonary hypertension, s/p STEMI with DES to RCS in July 2019. ECHO Dec 2019 with EF of 50-55%.   Lives with wife at home. She is 89. Two sons live close by, and one is next door and assists with needs. Patient noted black stool, "tarry", Saturday or Sunday. Episodes of heaving with lower abdominal pain now resolved. No abdominal pain currently. One episode of coffee-ground emesis. Last episode of melena this morning, small amount. Notes persistent burping and belching. States he has not taken Plavix for several days. Med list includes 81 mg aspirin. PPI BID as outpatient. States rare Ibuprofen.   Currently without abdominal pain, N/V, feeling better after 2 units PRBCs. No chest pain. Shortness of breath improved s/p blood transfusion.   Past Medical History:  Diagnosis Date  . Anemia   . Anxiety   . Aortic valve disorder   . Arthritis   . Barrett's esophagus 2012  . CHF (congestive heart failure) ([place])   . Coronary artery disease    mild by 2013 cath  . Depression   . Diverticulosis of colon (without mention of hemorrhage)   . Dizziness   . Dysphagia   . Dyspnea    with exertion  . GERD (gastroesophageal reflux disease)   . Hiatal hernia 2012  . Hypertension   . Kidney stones   . Murmur   . Pneumonia    " walking"  . Prostate cancer ([place])   . Rectal polyp   . Type II or unspecified type diabetes mellitus without mention of complication, not stated as uncontrolled     Past Surgical History:  Procedure Laterality Date  . ABDOMINAL AORTOGRAM W/LOWER EXTREMITY N/A 12/06/2017   Procedure: ABDOMINAL AORTOGRAM W/LOWER  EXTREMITY;  Surgeon: Conrad Annetta, MD;  Location: [facility];  Service: Cardiovascular;  Laterality: N/A;  . arm surgery     left  . CATARACT EXTRACTION    . CORONARY/GRAFT ACUTE MI REVASCULARIZATION N/A 04/13/2018   Procedure: Coronary/Graft Acute MI Revascularization;  Surgeon: [name], MD;  Location: [facility];  Service: Cardiovascular;   Laterality: N/A;  . CORONARY/GRAFT ANGIOGRAPHY N/A 04/13/2018   Procedure: CORONARY/GRAFT ANGIOGRAPHY;  Surgeon: [name], MD;  Location: [facility];  Service: Cardiovascular;  Laterality: N/A;  . ILIAC ATHERECTOMY Right 12/20/2017   Procedure: RIGHT ANTEGRADE FAILED CANNULATION OF RIGHT SUPERFICIAL FEMORAL ARTERY.;  Surgeon: Conrad Mount Jackson, MD;  Location: [facility];  Service: Vascular;  Laterality: Right;  . INGUINAL HERNIA REPAIR    . LEFT HEART CATHETERIZATION WITH CORONARY ANGIOGRAM N/A 07/23/2012   Procedure: LEFT HEART CATHETERIZATION WITH CORONARY ANGIOGRAM;  Surgeon: [name], MD;  Location: [facility];  Service: Cardiovascular;  Laterality: N/A;  . prostetic prostesis     x 2  . TRANSURETHRAL RESECTION OF PROSTATE      Prior to Admission medications   Medication Sig Start Date End Date Taking? Authorizing Provider  acetaminophen (TYLENOL ARTHRITIS PAIN) 650 MG CR tablet Take 650 mg by mouth 2 (two) times daily.    Yes [provider]  albuterol (PROVENTIL HFA) 108 (90 Base) MCG/ACT inhaler Inhale 2 puffs into the lungs every 6 (six) hours as needed for wheezing or shortness of breath.   Yes [provider]  atorvastatin (LIPITOR) 80 MG tablet Take 1 tablet (80 mg total) by mouth daily at 6 PM. Patient taking differently: Take 40 mg by mouth daily at 6 PM.  04/16/18  Yes Reino Bellis B, NP  brimonidine (ALPHAGAN) 0.2 % ophthalmic solution Place 1 drop into both eyes at bedtime.    Yes [provider]  cephALEXin (KEFLEX) 500 MG capsule Take 500 mg by mouth 3 (three) times daily. 11/26/18  Yes [provider]  cyanocobalamin (,VITAMIN B-12,) 1000 MCG/ML injection Patient wishes to have b12 injection done at Dr [name] office I have advised him to call and set these up since I do not know the office policy.  Inject one ML IM once a week for three weeks, then inject one ML Im once a month for one year. Then Patient needs repeat labs  done to recheck his b12 level. Patient taking differently: Inject 1,000 mcg into the muscle every 3 (three) months.  02/27/11  Yes Sable Feil, MD  escitalopram (LEXAPRO) 10 MG tablet Take 10 mg by mouth at bedtime.    Yes [provider]  fluticasone (FLONASE) 50 MCG/ACT nasal spray Place 2 sprays into both nostrils daily as needed for allergies or rhinitis.   Yes [provider]  furosemide (LASIX) 20 MG tablet Take 20 mg by mouth every other day.  11/26/18  Yes [provider]  metoprolol tartrate (LOPRESSOR) 25 MG tablet Take 1 tablet (25 mg total) by mouth 2 (two) times daily. 04/16/18  Yes Cheryln Manly, NP  nitroGLYCERIN (NITROSTAT) 0.4 MG SL tablet Place 1 tablet (0.4 mg total) under the tongue every 5 (five) minutes as needed. 04/16/18  Yes Cheryln Manly, NP  ondansetron (ZOFRAN) 4 MG tablet Take 4 mg by mouth daily as needed for nausea or vomiting.  10/02/18 10/02/19 Yes [provider]  pantoprazole (PROTONIX) 40 MG tablet Take 40 mg by mouth 2 (two) times daily.  10/28/18  Yes [provider]  potassium chloride (K-DUR) 10 MEQ tablet Take 10 mEq by mouth 2 (two) times daily.  09/24/18  Yes [provider]  sucralfate (CARAFATE) 1 GM/10ML suspension Take 1 g by mouth 4 (four) times daily.  10/28/18  Yes [provider]  amLODipine (NORVASC) 10 MG tablet Take 10 mg by mouth daily.     [provider]  aspirin 81 MG tablet Take 81 mg by mouth at bedtime.     [provider]  clopidogrel (PLAVIX) 75 MG tablet Take 75 mg by mouth daily.  12/04/18 12/04/19  [provider]  latanoprost (XALATAN) 0.005 % ophthalmic solution Place 1 drop into both eyes at bedtime.    [provider]  omeprazole (PRILOSEC) 20 MG capsule Take 20 mg by mouth daily.    [provider]  ticagrelor (BRILINTA) 90 MG TABS tablet Take 1 tablet (90 mg total) by mouth 2 (two) times daily. Patient not taking:  Reported on 12/23/2018 04/16/18   Cheryln Manly, NP    Current Facility-Administered Medications  Medication Dose Route Frequency Provider Last Rate Last Dose  . 0.9 % NaCl with KCl 20 mEq/ L  infusion   Intravenous Continuous Emokpae, Ejiroghene E, MD 50 mL/hr at 12/24/18 0300    . acetaminophen (TYLENOL) tablet 650 mg  650 mg Oral Q6H PRN Emokpae, Ejiroghene E, MD       Or  . acetaminophen (TYLENOL) suppository 650 mg  650 mg Rectal Q6H PRN Emokpae, Ejiroghene E, MD      . atorvastatin (LIPITOR) tablet 80 mg  80 mg Oral q1800 Emokpae, Ejiroghene E, MD   80 mg at 12/23/18 2306  . brimonidine (ALPHAGAN) 0.2 % ophthalmic solution 1 drop  1 drop Both Eyes QHS Emokpae, Ejiroghene E, MD   1 drop at 12/23/18 2306  . insulin aspart (novoLOG) injection 0-9 Units  0-9 Units Subcutaneous Q4H Emokpae, Ejiroghene E, MD      . ondansetron (ZOFRAN) tablet 4 mg  4 mg Oral Q6H PRN Emokpae, Ejiroghene E, MD       Or  . ondansetron (ZOFRAN) injection 4 mg  4 mg Intravenous Q6H PRN Emokpae, Ejiroghene E, MD      . pantoprazole (PROTONIX) 80 mg in sodium chloride 0.9 % 250 mL (0.32 mg/mL) infusion  8 mg/hr Intravenous Continuous Emokpae, Ejiroghene E, MD 25 mL/hr at 12/24/18 0559 8 mg/hr at 12/24/18 0559    Allergies as of 12/23/2018 - Review Complete 12/23/2018  Allergen Reaction Noted  . Ciprofloxacin Other (See Comments) 10/15/2014    Family History  Problem Relation Age of Onset  . Ovarian cancer Sister   . Prostate cancer Brother   . Diabetes Brother   . Heart disease Father   . Colon cancer Neg Hx     Social History   Socioeconomic History  . Marital status: Married    Spouse name: Not on file  . Number of children: Not on file  . Years of education: Not on file  . Highest education level: Not on file  Occupational History  . Not on file  Social Needs  . Financial resource strain: Not on file  . Food insecurity:    Worry: Not on file    Inability: Not on file  . Transportation  needs:    Medical: Not on file    Non-medical: Not on file  Tobacco Use  . Smoking status: Never Smoker  . Smokeless tobacco: Never Used  Substance and Sexual Activity  . Alcohol use: No  . Drug use: No  . Sexual activity: Not on file  Lifestyle  . Physical activity:    Days per week: Not on file    Minutes per session: Not on file  . Stress: Not on file  Relationships  . Social connections:    Talks on phone: Not on file    Gets together: Not on file    Attends religious service: Not on file    Active member of club or organization: Not on file    Attends meetings of clubs or organizations: Not on file    Relationship status: Not on file  . Intimate partner violence:    Fear of current or ex partner: Not on file    Emotionally abused: Not on file    Physically abused: Not on file    Forced sexual activity: Not on file  Other Topics Concern  . Not on file  Social History Narrative  . Not on file    Review of Systems: As mentioned in HPI  Physical Exam: Vital signs in last 24 hours: Temp:  [97.4 F (36.3 C)-98.5 F (36.9 C)] 98.2 F (36.8 C) (03/31 0132) Pulse Rate:  [81-118] 82 (03/31 0132) Resp:  [15-26] 17 (03/31 0132) BP: (102-152)/(49-89) 102/53 (03/31 0132) SpO2:  [90 %-99 %] 99 % (03/31 0132) Weight:  [62.6 kg] 62.6 kg (03/30 1108) Last BM Date: 12/23/18 General:   Alert,  Well-developed, well-nourished, pleasant and cooperative in NAD Head:  Normocephalic and atraumatic. Eyes:  Sclera clear, no icterus.   Conjunctiva pink. Ears:  Hard of hearing Nose:  No deformity, discharge,  or lesions. Mouth:  No deformity or lesions Lungs:  Clear throughout to auscultation.  Heart:  S1 S2 present, harsh systolic murmur Abdomen:  Soft, nontender and nondistended. No masses, hepatosplenomegaly or hernias noted. Normal bowel sounds, without guarding, and without rebound.   Rectal:  Deferred  Msk:  Symmetrical without gross deformities. Normal posture. Extremities:   Without edema. Neurologic:  Alert and  oriented x4 Psych: Normal mood and affect.  Intake/Output from previous day: 03/30 0701 - 03/31 0700 In: 1433.1 [I.V.:375.1; Blood:1058] Out: -  Intake/Output this shift: No intake/output data recorded.  Lab Results: Recent Labs    12/23/18 1120 12/23/18 1854 12/24/18 0159  WBC 23.3*  --  18.0*  HGB 6.2* 7.4* 7.4*  HCT 20.0* 23.1* 23.0*  PLT 280  --  193   BMET Recent Labs    12/23/18 1120  NA 137  K 3.8  CL 107  CO2 20*  GLUCOSE 200*  BUN 56*  CREATININE 0.88  CALCIUM 8.5*   LFT Recent Labs    12/23/18 1120  PROT 6.6  ALBUMIN 3.5  AST 18  ALT 9  ALKPHOS 30  BILITOT 0.8    Impression: 85 year old male who independently lives at home with his 78 year old wife, presenting with UGI bleed and acute blood loss anemia in setting of known PUD requiring bleeding control therapy Jan 2020. Not a candidate for IR intervention due to anatomy as noted above. Receiving 2 units PRBCs with improvement in Hgb to 7.4. Due to history of STEMI s/p stent placement July 2019, he had been restarted on Brilinta prior to discharge from [facility] in Jan 2020. However, he had not been taking this and was recently placed on Plavix several weeks ago by Cardiologist. I note his med list also includes 81 mg aspirin daily. He has not taken Plavix in  several days. Symptomatically improved s/p blood transfusion. Last episode of melena this morning.  Aware of need for EGD today. Discussed risks and benefits with patient, who stated understanding. He is fully capable of signing own consents. I am also calling his son, Vincent Savage, to inform of plan. He has been NPO since midnight. Pursuing EGD with Propofol by Dr. Oneida Alar today.  Leukocytosis: WBC count 23 on admission, now 18. Abdominal pain several days ago, similar to pain he noted with prior UGI bleed in Jan 2020. Currently without abdominal pain. Continue to follow.   Plan: Avoid NSAIDs, stop aspirin Remain  NPO Continue PPI infusion EGD with Propofol today by Dr. Oneida Alar Contacting son regarding plan of care Further recommendations to follow   Annitta Needs, PhD, ANP-BC [facility]     LOS: 0 days    12/24/2018, 7:58 AM

## 2018-12-24 NOTE — Progress Notes (Signed)
I spoke with Edwena Blow, patient's son. Phone #: [phone]. He is aware of plans for procedure and thankful. Asks that we keep him updated with findings.   Annitta Needs, PhD, ANP-BC [facility]

## 2018-12-24 NOTE — Anesthesia Preprocedure Evaluation (Signed)
Anesthesia Evaluation    Airway Mallampati: III       Dental  (+) Teeth Intact, Dental Advidsory Given   Pulmonary shortness of breath, pneumonia,     + decreased breath sounds      Cardiovascular hypertension, + CAD, + Past MI and +CHF  + Valvular Problems/Murmurs  Rhythm:regular     Neuro/Psych PSYCHIATRIC DISORDERS Anxiety Depression    GI/Hepatic   Endo/Other  Type 2  Renal/GU      Musculoskeletal   Abdominal   Peds  Hematology   Anesthesia Other Findings Complicated anesthetic Jan 2020 with lidocaine, propofol with hypotension tx with Phenylephrine in setting of advanced age, Aortic Stenosis, ACS with h/o STEMI and ascvd 12 lead ST with PACs RBBB, bifasicular block GI bleed s/p 1 unit   Reproductive/Obstetrics                             Anesthesia Physical Anesthesia Plan  ASA: IV  Anesthesia Plan: MAC   Post-op Pain Management:    Induction:   PONV Risk Score and Plan:   Airway Management Planned:   Additional Equipment:   Intra-op Plan:   Post-operative Plan:   Informed Consent: I have reviewed the patients History and Physical, chart, labs and discussed the procedure including the risks, benefits and alternatives for the proposed anesthesia with the patient or authorized representative who has indicated his/her understanding and acceptance.     Dental Advisory Given  Plan Discussed with: Anesthesiologist  Anesthesia Plan Comments:         Anesthesia Quick Evaluation

## 2018-12-24 NOTE — Transfer of Care (Signed)
Immediate Anesthesia Transfer of Care Note  Patient: Vincent RARICK Sr.  Procedure(s) Performed: ESOPHAGOGASTRODUODENOSCOPY (EGD) WITH PROPOFOL (N/A ) BIOPSY  Patient Location: PACU  Anesthesia Type:MAC  Level of Consciousness: awake, oriented and patient cooperative  Airway & Oxygen Therapy: Patient Spontanous Breathing  Post-op Assessment: Report given to RN and Post -op Vital signs reviewed and stable  Post vital signs: Reviewed and stable  Last Vitals:  Vitals Value Taken Time  BP 147/66 12/24/2018 11:05 AM  Temp    Pulse 86 12/24/2018 11:08 AM  Resp 17 12/24/2018 11:08 AM  SpO2 91 % 12/24/2018 11:08 AM  Vitals shown include unvalidated device data.  Last Pain:  Vitals:   12/24/18 1034  TempSrc:   PainSc: 0-No pain         Complications: No apparent anesthesia complications

## 2018-12-24 NOTE — Anesthesia Procedure Notes (Signed)
Procedure Name: [place] Performed by: Andree Elk Kahlyn Shippey A, CRNA Pre-anesthesia Checklist: Patient identified, Emergency Drugs available, Suction available, Timeout performed and Patient being monitored Patient Re-evaluated:Patient Re-evaluated prior to induction Oxygen Delivery Method: Non-rebreather mask

## 2018-12-24 NOTE — Anesthesia Postprocedure Evaluation (Signed)
Anesthesia Post Note  Patient: Vincent PARENTEAU Sr.  Procedure(s) Performed: ESOPHAGOGASTRODUODENOSCOPY (EGD) WITH PROPOFOL (N/A ) BIOPSY  Patient location during evaluation: PACU Anesthesia Type: MAC Level of consciousness: awake and alert and oriented Pain management: pain level controlled Vital Signs Assessment: post-procedure vital signs reviewed and stable Respiratory status: spontaneous breathing Cardiovascular status: stable Postop Assessment: no apparent nausea or vomiting Anesthetic complications: no     Last Vitals:  Vitals:   12/24/18 1020 12/24/18 1027  BP: 117/68   Pulse:    Resp:    Temp:    SpO2:  100%    Last Pain:  Vitals:   12/24/18 1034  TempSrc:   PainSc: 0-No pain                 Oryon Gary A

## 2018-12-24 NOTE — Care Management (Signed)
Received call from Judson Roch of [facility]. Patient is active with their services. Judson Roch will need to be notified of DC.

## 2018-12-24 NOTE — Op Note (Signed)
[facility] Patient Name: Vincent Savage Procedure Date: 12/24/2018 10:05 AM MRN: [id number] Date of Birth: October 30, 1917 Attending MD: Barney Drain MD, MD CSN: [id number] Age: 85 Admit Type: Outpatient Procedure:                Upper GI endoscopy WITH COLD FORCEPS                            BIOPSY/ESOPHAGEAL DILATION Indications:              Melena, Suspected gastric ulcer with hemorrhage,                            Suspected duodenal ulcer with hemorrhage ON PLAVIX:                            PMHx: PUD, 1970s, JAN 2020 [place], MAR 2020. Providers:                Barney Drain MD, MD, Jeanann Lewandowsky. Sharon Seller, RN, Aram Candela Referring MD:             Dione Housekeeper Medicines:                Propofol per Anesthesia Complications:            No immediate complications. Estimated Blood Loss:     Estimated blood loss was minimal. Procedure:                Pre-Anesthesia Assessment:                           - Prior to the procedure, a History and Physical                            was performed, and patient medications and                            allergies were reviewed. The patient's tolerance of                            previous anesthesia was also reviewed. The risks                            and benefits of the procedure and the sedation                            options and risks were discussed with the patient.                            All questions were answered, and informed consent                            was obtained. Prior Anticoagulants: The patient has  taken Plavix (clopidogrel), last dose was 7 days                            prior to procedure. ASA Grade Assessment: II - A                            patient with mild systemic disease. After reviewing                            the risks and benefits, the patient was deemed in                            satisfactory condition to undergo the procedure.                             After obtaining informed consent, the endoscope was                            passed under direct vision. Throughout the                            procedure, the patient's blood pressure, pulse, and                            oxygen saturations were monitored continuously. The                            [id number]-H190 ([id number]) scope was introduced through the                            mouth, and advanced to the second part of duodenum.                            The upper GI endoscopy was accomplished without                            difficulty. The patient tolerated the procedure                            well. Scope In: 10:38:21 AM Scope Out: 10:52:58 AM Total Procedure Duration: 0 hours 14 minutes 37 seconds  Findings:      Barrett's esophagus was present in the distal esophagus.      Diffuse moderate inflammation characterized by congestion (edema),       erosions and erythema was found in the entire examined stomach.       Biopsies(2: BODY, 1; INCISURA, 2: ANTRUM) were taken with a cold forceps       for Helicobacter pylori testing.      One non-bleeding cratered duodenal ulcer with a visible vessel was found       in the duodenal bulb. The lesion was six mm by ten mm in largest       dimension. Area was successfully injected with 4 mL of a 1:10,000       solution of epinephrine for drug delivery.  For hemostasis, two       hemostatic clips were successfully placed (MR conditional). There was no       bleeding during the procedure.      The second portion of the duodenum was normal.      A medium-sized hiatal hernia was present. Impression:               - Barrett's esophagus NOT BIOPSIED                           - MODERATE Gastritis. Biopsied.                           - MELENA/GI BLEED DUE TO duodenal ulcer with a                            visible vessel.                           - Medium-sized hiatal hernia. Moderate Sedation:      Per Anesthesia  Care Recommendation:           - Return patient to hospital ward for ongoing care.                           - Full liquid diet. CBC IN AM.                           - Continue present medications. PROTONIX GTT FOR 72                            HRS THEN BID FOR ONE MONTH THEN ONCE DAILY. HOLD                            ASA FOR 7 DAYS. MAY RE-START PLAVIX IN 72 HOURS IF                            MEDICALLY NECESSARY.                           - Await pathology results.                           - Return to my office in 6 months.                           - PT IS NOT A CANDIDATE FOR ANGIO/EMBOLIZATION PER                            RADIOLOGY JAN 2020 [place]. IF HE REBELEEDS                            OR DEVELOPS A TRANSFUSION DEPENDENT ANEMIA, WOULD                            REPEAT EGD FOR EPI/CAUTERY/ADDITIONAL CLIPS. Procedure  Code(s):        --- Professional ---                           [phone], 59, Esophagogastroduodenoscopy, flexible,                            transoral; with control of bleeding, any method                           43239, Esophagogastroduodenoscopy, flexible,                            transoral; with biopsy, single or multiple                           43236, 59, Esophagogastroduodenoscopy, flexible,                            transoral; with directed submucosal injection(s),                            any substance Diagnosis Code(s):        --- Professional ---                           K22.70, Barrett's esophagus without dysplasia                           K29.70, Gastritis, unspecified, without bleeding                           K26.4, Chronic or unspecified duodenal ulcer with                            hemorrhage                           K44.9, Diaphragmatic hernia without obstruction or                            gangrene                           K92.1, Melena (includes Hematochezia) CPT copyright 2019 American Medical Association. All rights  reserved. The codes documented in this report are preliminary and upon coder review may  be revised to meet current compliance requirements. Barney Drain, MD Barney Drain MD, MD 12/24/2018 11:13:59 AM This report has been signed electronically. Number of Addenda: 0

## 2018-12-25 DIAGNOSIS — K264 Chronic or unspecified duodenal ulcer with hemorrhage: Secondary | ICD-10-CM

## 2018-12-25 LAB — GLUCOSE, CAPILLARY
Glucose-Capillary: 116 mg/dL — ABNORMAL HIGH (ref 70–99)
Glucose-Capillary: 133 mg/dL — ABNORMAL HIGH (ref 70–99)
Glucose-Capillary: 135 mg/dL — ABNORMAL HIGH (ref 70–99)
Glucose-Capillary: 108 mg/dL — ABNORMAL HIGH (ref 70–99)
Glucose-Capillary: 150 mg/dL — ABNORMAL HIGH (ref 70–99)

## 2018-12-25 LAB — CBC
HEMATOCRIT: 23.9 % — AB (ref 39.0–52.0)
Hemoglobin: 7.5 g/dL — ABNORMAL LOW (ref 13.0–17.0)
MCH: 31.4 pg (ref 26.0–34.0)
MCHC: 31.4 g/dL (ref 30.0–36.0)
MCV: 100 fL (ref 80.0–100.0)
Platelets: 198 10*3/uL (ref 150–400)
RBC: 2.39 MIL/uL — ABNORMAL LOW (ref 4.22–5.81)
RDW: 16.5 % — ABNORMAL HIGH (ref 11.5–15.5)
WBC: 17.7 10*3/uL — ABNORMAL HIGH (ref 4.0–10.5)
nRBC: 0.2 % (ref 0.0–0.2)

## 2018-12-25 MED ORDER — SODIUM CHLORIDE 0.9 % IV BOLUS
500.0000 mL | Freq: Once | INTRAVENOUS | Status: AC
  Administered 2018-12-25: 21:00:00 500 mL via INTRAVENOUS

## 2018-12-25 NOTE — Plan of Care (Signed)
  Problem: Acute Rehab PT Goals(only PT should resolve) Goal: Pt Will Go Supine/Side To Sit Flowsheets (Taken 12/25/2018 1156) Pt will go Supine/Side to Sit: with modified independence Goal: Patient Will Transfer Sit To/From Stand Flowsheets (Taken 12/25/2018 1156) Patient will transfer sit to/from stand: with modified independence Goal: Pt Will Transfer Bed To Chair/Chair To Bed Flowsheets (Taken 12/25/2018 1156) Pt will Transfer Bed to Chair/Chair to Bed: with supervision Goal: Pt Will Ambulate Flowsheets (Taken 12/25/2018 1156) Pt will Ambulate: > 125 feet; with modified independence; with rolling walker   11:56 AM, 12/25/18 [name], MPT Physical Therapist with [facility] [phone] office [phone] mobile phone

## 2018-12-25 NOTE — Progress Notes (Addendum)
  Subjective:  Patient without abdominal pain. Tolerating liquid diet.  Spoke with nursing staff, no bowel movements overnight.  Objective: Vital signs in last 24 hours: Temp:  [97.5 F (36.4 C)-98.7 F (37.1 C)] 97.5 F (36.4 C) (04/01 0640) Pulse Rate:  [79-105] 80 (04/01 0640) Resp:  [15-20] 20 (04/01 0640) BP: (115-161)/(56-80) 115/56 (04/01 0640) SpO2:  [88 %-100 %] 96 % (04/01 0640) Last BM Date: 12/24/18 General:   Alert,  Well-developed, well-nourished, pleasant and cooperative in NAD Head:  Normocephalic and atraumatic. Eyes:  Sclera clear, no icterus.   Abdomen:  Soft, nontender and nondistended.Normal bowel sounds, without guarding, and without rebound.   Extremities:  Without clubbing, deformity or edema. Neurologic:  Alert and  oriented x4 Psych:  Alert and cooperative. Normal mood and affect.  Intake/Output from previous day: 03/31 0701 - 04/01 0700 In: 700 [I.V.:700] Out: 300 [Urine:300] Intake/Output this shift: No intake/output data recorded.  Lab Results: CBC Recent Labs    12/23/18 1120 12/23/18 1854 12/24/18 0159 12/25/18 0455  WBC 23.3*  --  18.0* 17.7*  HGB 6.2* 7.4* 7.4* 7.5*  HCT 20.0* 23.1* 23.0* 23.9*  MCV 100.0  --  96.6 100.0  PLT 280  --  193 198   BMET Recent Labs    12/23/18 1120  NA 137  K 3.8  CL 107  CO2 20*  GLUCOSE 200*  BUN 56*  CREATININE 0.88  CALCIUM 8.5*   LFTs Recent Labs    12/23/18 1120  BILITOT 0.8  ALKPHOS 47  AST 18  ALT 9  PROT 6.6  ALBUMIN 3.5   No results for input(s): LIPASE in the last 72 hours. PT/INR No results for input(s): LABPROT, INR in the last 72 hours.    Imaging Studies: No results found.[2 weeks]   Assessment: 85 year old male presenting with upper GI bleed and acute blood loss anemia in the setting of known peptic ulcer disease requiring bleeding control therapy back in January 2020.  Not a candidate for IR intervention due to anatomy.  He has received 2 units of packed red  blood cells this admission.  H/H stable. History of STEMI status post stent placement July 2019.  Initially on Brilinta but more recently on Plavix/aspirin.  Patient underwent EGD yesterday with moderate gastritis, one non-bleeding cratered duodenal ulcer with visible vessel, s/p epi injection/hemostatic clips.   Plan: 1. Follow-up biopsies. 2. Continue Protonix drip for 72 hours, then twice daily for 1 month, then daily forever. 3. Avoid aspirin indefinitely unless medically necessary.  Consider Plavix if needed but hold at least 72 hours.   4. Repeat EGD if needed for rebleeding, patient is not a candidate for embolization. 5. Return to the office in 6 months.  [name]. [name] [facility] [phone] 4/1/20208:42 AM     LOS: 1 day

## 2018-12-25 NOTE — Plan of Care (Signed)

## 2018-12-25 NOTE — Progress Notes (Signed)
PROGRESS NOTE    Vincent Savage  DTO:[id number]  DOB: May 01, 1918  DOA: 12/23/2018 PCP: Dione Housekeeper, MD   Brief Admission Hx: 85 year old gentleman presented to the hospital with recurrent GI bleeding.  He was taken for EGD on 12/24/2018.  MDM/Assessment & Plan:   1. Recurrent upper GI bleeding-likely secondary to duodenal ulceration which was treated by Dr. Oneida Alar with some clipping on 12/24/2018.  The patient remains on IV Protonix infusion for 72 hours.  He is feeling better.  He is tolerating diet.  His hemoglobin is holding stable at 7.5. 2. Coronary artery disease status post STEMI 04/13/2018-DES to ostial RCA.  He initially had been on dual antiplatelet therapy but subsequent GI bleeding let his cardiologist to switch him to Plavix.  His Plavix is currently being held due to recurrent GI bleeding.  GI discussed with family and at this time they are not comfortable restarting him on Plavix due to risk of GI bleeding.  The patient will discuss with his outpatient cardiologist. 3. Leukocytosis-WBC trending down.  No signs of infection found.  Suspect secondary to GI bleeding. 4. Essential hypertension- currently holding amlodipine.  His blood pressures have been very well controlled and stable. 5. Severe aortic stenosis- outpatient follow-up with his cardiologist who had been making plans to proceed with work-up for transcatheter aortic valve replacement.  DVT prophylaxis: SCDs Code Status: Full Family Communication:  Disposition Plan: home in 1-2 days    Consultants:  GI   Procedures:  EGD 12/24/18  Antimicrobials:     Subjective: Patient says he feels much better and asking about going home.  He has had no recurrent GI bleeding.  Objective: Vitals:   12/24/18 1259 12/24/18 1937 12/24/18 2117 12/25/18 0640  BP: 116/63  124/60 (!) 115/56  Pulse: 82  (!) 105 80  Resp: 17  17 20   Temp: 98.1 F (36.7 C)  97.8 F (36.6 C) (!) 97.5 F (36.4 C)  TempSrc:   Oral  Oral  SpO2: 91% 93% 94% 96%  Weight:      Height:        Intake/Output Summary (Last 24 hours) at 12/25/2018 0804 Last data filed at 12/24/2018 1700 Gross per 24 hour  Intake 700 ml  Output 300 ml  Net 400 ml   Filed Weights   12/23/18 1108  Weight: 62.6 kg     REVIEW OF SYSTEMS  As per history otherwise all reviewed and reported negative  Exam:  General exam: Elderly male sitting up in the bed he is awake and alert in no distress. Respiratory system: Bilateral breath sounds clear. No increased work of breathing. Cardiovascular system: S1 & S2 heard.  Gastrointestinal system: Abdomen is nondistended, soft and nontender. Normal bowel sounds heard. Central nervous system: Alert and oriented. No focal neurological deficits. Extremities: no cyanosis or clubbing.  Data Reviewed: Basic Metabolic Panel: Recent Labs  Lab 12/23/18 1120 12/24/18 0159  NA 137  --   K 3.8  --   CL 107  --   CO2 20*  --   GLUCOSE 200*  --   BUN 56*  --   CREATININE 0.88  --   CALCIUM 8.5*  --   MG  --  1.7   Liver Function Tests: Recent Labs  Lab 12/23/18 1120  AST 18  ALT 9  ALKPHOS 47  BILITOT 0.8  PROT 6.6  ALBUMIN 3.5   No results for input(s): LIPASE, AMYLASE in the last 168 hours. No results for input(s):  AMMONIA in the last 168 hours. CBC: Recent Labs  Lab 12/23/18 1120 12/23/18 1854 12/24/18 0159 12/25/18 0455  WBC 23.3*  --  18.0* 17.7*  NEUTROABS  --   --  14.2*  --   HGB 6.2* 7.4* 7.4* 7.5*  HCT 20.0* 23.1* 23.0* 23.9*  MCV 100.0  --  96.6 100.0  PLT 280  --  193 198   Cardiac Enzymes: No results for input(s): CKTOTAL, CKMB, CKMBINDEX, TROPONINI in the last 168 hours. CBG (last 3)  Recent Labs    12/24/18 2355 12/25/18 0406 12/25/18 0730  GLUCAP 115* 116* 133*   No results found for this or any previous visit (from the past 240 hour(s)).   Studies: No results found.  Scheduled Meds: . atorvastatin  80 mg Oral q1800  . brimonidine  1 drop Both Eyes  QHS  . insulin aspart  0-9 Units Subcutaneous Q4H  . lidocaine  5 mL Mouth/Throat Once  . metoprolol tartrate  12.5 mg Oral BID   Continuous Infusions: . pantoprozole (PROTONIX) infusion 8 mg/hr (12/24/18 0559)    Active Problems:   Upper GI bleed   Time spent:   Irwin Brakeman, MD [facility] 12/25/2018, 8:04 AM    LOS: 1 day  How to contact the [facility] Attending or Consulting provider [place] or covering provider during after hours [place], for this patient?  1. Check the care team in [facility] and look for a) attending/consulting TRH provider listed and b) the [facility] team listed 2. Log into [email or web address] and use [place]'s universal password to access. If you do not have the password, please contact the hospital operator. 3. Locate the [facility] provider you are looking for under [facility] and page to a number that you can be directly reached. 4. If you still have difficulty reaching the provider, please page the [facility] (Director on Call) for the Hospitalists listed on amion for assistance.

## 2018-12-25 NOTE — Evaluation (Signed)
Physical Therapy Evaluation Patient Details Name: Vincent ENDERSON Sr. MRN: [id number] DOB: October 17, 1917 Today's Date: 12/25/2018   History of Present Illness  Vincent Thrun. is a 85 y.o. male with medical history significant for GI bleed, STEMI 03/2018 , who presented with c/o several days of black stools, patient's home health RN also reported coffee-ground emesis today.  He denies abdominal pain.  No chest pain.    Clinical Impression  Patient functioning near baseline for functional mobility and gait, demonstrates labored movement for sit to stands, transfers when not using an AD, safer using RW, ambulated in hallway without loss of balance and tolerated sitting up in chair after therapy.  Patient will benefit from continued physical therapy in hospital and recommended venue below to increase strength, balance, endurance for safe ADLs and gait.     Follow Up Recommendations Home health PT    Equipment Recommendations  None recommended by PT    Recommendations for Other Services       Precautions / Restrictions Precautions Precautions: Fall Restrictions Weight Bearing Restrictions: No      Mobility  Bed Mobility Overal bed mobility: Needs Assistance Bed Mobility: Supine to Sit     Supine to sit: Supervision     General bed mobility comments: slightly labored movement  Transfers Overall transfer level: Needs assistance Equipment used: Rolling walker (2 wheeled) Transfers: Sit to/from [employer] Transfers Sit to Stand: Min guard;Supervision Stand pivot transfers: Min guard       General transfer comment: slow labored movement  Ambulation/Gait Ambulation/Gait assistance: Supervision Gait Distance (Feet): 100 Feet Assistive device: Rolling walker (2 wheeled) Gait Pattern/deviations: Decreased step length - right;Decreased step length - left;Decreased stride length Gait velocity: slightly decreased   General Gait Details: slightly labored cadence  without loss of balance  Stairs            Wheelchair Mobility    Modified Rankin (Stroke Patients Only)       Balance Overall balance assessment: Needs assistance Sitting-balance support: Feet supported;No upper extremity supported Sitting balance-Leahy Scale: Good     Standing balance support: Bilateral upper extremity supported;During functional activity Standing balance-Leahy Scale: Fair Standing balance comment: using RW                             Pertinent Vitals/Pain Pain Assessment: No/denies pain    Home Living Family/patient expects to be discharged to:: Private residence Living Arrangements: Spouse/significant other   Type of Home: House Home Access: Stairs to enter Entrance Stairs-Rails: Right;Left;Can reach both Entrance Stairs-Number of Steps: 2-3 Home Layout: Two level;Laundry or work area in [place]: Environmental consultant - 4 wheels;Walker - 2 wheels;Cane - single point;Wheelchair - [employer];Shower seat      Prior Function Level of Independence: Independent with assistive device(s);Needs assistance   Gait / Transfers Assistance Needed: household ambulator with Rollator  ADL's / Homemaking Assistance Needed: assisted by spouse        Hand Dominance        Extremity/Trunk Assessment   Upper Extremity Assessment Upper Extremity Assessment: Generalized weakness    Lower Extremity Assessment Lower Extremity Assessment: Generalized weakness    Cervical / Trunk Assessment Cervical / Trunk Assessment: Normal  Communication   Communication: No difficulties  Cognition Arousal/Alertness: Awake/alert Behavior During Therapy: WFL for tasks assessed/performed Overall Cognitive Status: Within Functional Limits for tasks assessed  General Comments      Exercises     Assessment/Plan    PT Assessment Patient needs continued PT services  PT Problem List  Decreased strength;Decreased activity tolerance;Decreased balance;Decreased mobility       PT Treatment Interventions Therapeutic exercise;Gait training;Stair training;Functional mobility training;Therapeutic activities;Patient/family education    PT Goals (Current goals can be found in the Care Plan section)  Acute Rehab PT Goals Patient Stated Goal: return home with spouse to assist PT Goal Formulation: With patient Time For Goal Achievement: 01/01/19 Potential to Achieve Goals: Good    Frequency Min 3X/week   Barriers to discharge        Co-evaluation               AM-PAC PT "6 Clicks" Mobility  Outcome Measure Help needed turning from your back to your side while in a flat bed without using bedrails?: None Help needed moving from lying on your back to sitting on the side of a flat bed without using bedrails?: None Help needed moving to and from a bed to a chair (including a wheelchair)?: A Little Help needed standing up from a chair using your arms (e.g., wheelchair or bedside chair)?: A Little Help needed to walk in hospital room?: A Little Help needed climbing 3-5 steps with a railing? : A Little 6 Click Score: 20    End of Session   Activity Tolerance: Patient tolerated treatment well;Patient limited by fatigue Patient left: in chair;with call bell/phone within reach;with chair alarm set Nurse Communication: Mobility status PT Visit Diagnosis: Unsteadiness on feet (R26.81);Other abnormalities of gait and mobility (R26.89);Muscle weakness (generalized) (M62.81)    Time: 5681-2751 PT Time Calculation (min) (ACUTE ONLY): 26 min   Charges:   PT Evaluation $PT Eval Moderate Complexity: 1 Mod PT Treatments $Therapeutic Activity: 23-37 mins        11:47 AM, 12/25/18 [name], MPT Physical Therapist with [facility] [phone] office [phone] mobile phone

## 2018-12-26 ENCOUNTER — Encounter (HOSPITAL_COMMUNITY): Payer: Self-pay | Admitting: Gastroenterology

## 2018-12-26 DIAGNOSIS — I10 Essential (primary) hypertension: Secondary | ICD-10-CM

## 2018-12-26 DIAGNOSIS — K264 Chronic or unspecified duodenal ulcer with hemorrhage: Principal | ICD-10-CM

## 2018-12-26 LAB — GLUCOSE, CAPILLARY
Glucose-Capillary: 102 mg/dL — ABNORMAL HIGH (ref 70–99)
Glucose-Capillary: 124 mg/dL — ABNORMAL HIGH (ref 70–99)
Glucose-Capillary: 125 mg/dL — ABNORMAL HIGH (ref 70–99)
Glucose-Capillary: 130 mg/dL — ABNORMAL HIGH (ref 70–99)
Glucose-Capillary: 141 mg/dL — ABNORMAL HIGH (ref 70–99)
Glucose-Capillary: 95 mg/dL (ref 70–99)

## 2018-12-26 LAB — CBC WITH DIFFERENTIAL/PLATELET
Abs Immature Granulocytes: 0.1 10*3/uL — ABNORMAL HIGH (ref 0.00–0.07)
Basophils Absolute: 0 10*3/uL (ref 0.0–0.1)
Basophils Relative: 0 %
Eosinophils Absolute: 0.4 10*3/uL (ref 0.0–0.5)
Eosinophils Relative: 3 %
HCT: 25.1 % — ABNORMAL LOW (ref 39.0–52.0)
Hemoglobin: 7.7 g/dL — ABNORMAL LOW (ref 13.0–17.0)
Immature Granulocytes: 1 %
Lymphocytes Relative: 12 %
Lymphs Abs: 1.8 10*3/uL (ref 0.7–4.0)
MCH: 31 pg (ref 26.0–34.0)
MCHC: 30.7 g/dL (ref 30.0–36.0)
MCV: 101.2 fL — ABNORMAL HIGH (ref 80.0–100.0)
Monocytes Absolute: 1.1 10*3/uL — ABNORMAL HIGH (ref 0.1–1.0)
Monocytes Relative: 8 %
Neutro Abs: 11.2 10*3/uL — ABNORMAL HIGH (ref 1.7–7.7)
Neutrophils Relative %: 76 %
Platelets: 222 10*3/uL (ref 150–400)
RBC: 2.48 MIL/uL — ABNORMAL LOW (ref 4.22–5.81)
RDW: 17.2 % — ABNORMAL HIGH (ref 11.5–15.5)
WBC: 14.6 10*3/uL — ABNORMAL HIGH (ref 4.0–10.5)
nRBC: 0 % (ref 0.0–0.2)

## 2018-12-26 LAB — PREPARE RBC (CROSSMATCH)

## 2018-12-26 MED ORDER — INSULIN ASPART 100 UNIT/ML ~~LOC~~ SOLN
0.0000 [IU] | Freq: Three times a day (TID) | SUBCUTANEOUS | Status: DC
  Administered 2018-12-26 – 2018-12-27 (×3): 1 [IU] via SUBCUTANEOUS

## 2018-12-26 NOTE — Plan of Care (Signed)

## 2018-12-26 NOTE — Progress Notes (Signed)
Reviewed CBC results. Hemoglobin stable at 7.7. WBC improved down from 23,300 to 14,600. No further recommendations at this time. See today's office note.   [name]. [name] [facility] [phone] 4/2/202010:48 AM

## 2018-12-26 NOTE — Progress Notes (Addendum)
Subjective:  Wants to advance diet. No abdominal pain. No dysuria, coughing, sob.   Objective: Vital signs in last 24 hours: Temp:  [97.7 F (36.5 C)-99.4 F (37.4 C)] 98.2 F (36.8 C) (04/02 0521) Pulse Rate:  [81-130] 83 (04/02 0521) Resp:  [18-20] 18 (04/02 0521) BP: (116-149)/(61-71) 116/62 (04/02 0521) SpO2:  [94 %-99 %] 99 % (04/02 0521) Last BM Date: 12/24/18 General:   Alert,  Well-developed, well-nourished, pleasant and cooperative in NAD Head:  Normocephalic and atraumatic. Eyes:  Sclera clear, no icterus.  Chest: CTA bilaterally without rales, rhonchi, crackles.    Abdomen:  Soft, nontender and nondistended.  Extremities:  Without clubbing, deformity or edema. Neurologic:  Alert and  oriented x4;    Psych:  Alert and cooperative. Normal mood and affect.  Intake/Output from previous day: 04/01 0701 - 04/02 0700 In: 240 [P.O.:240] Out: 450 [Urine:450] Intake/Output this shift: No intake/output data recorded.  Lab Results: CBC Recent Labs    12/23/18 1120 12/23/18 1854 12/24/18 0159 12/25/18 0455  WBC 23.3*  --  18.0* 17.7*  HGB 6.2* 7.4* 7.4* 7.5*  HCT 20.0* 23.1* 23.0* 23.9*  MCV 100.0  --  96.6 100.0  PLT 280  --  193 198   BMET Recent Labs    12/23/18 1120  NA 137  K 3.8  CL 107  CO2 20*  GLUCOSE 200*  BUN 56*  CREATININE 0.88  CALCIUM 8.5*   LFTs Recent Labs    12/23/18 1120  BILITOT 0.8  ALKPHOS 47  AST 18  ALT 9  PROT 6.6  ALBUMIN 3.5   No results for input(s): LIPASE in the last 72 hours. PT/INR No results for input(s): LABPROT, INR in the last 72 hours.    Imaging Studies: No results found.[2 weeks]   Assessment:  85 y/o male presenting with UBI bleed and acute blood loss anemia in setting of known PUD requiring bleeding control therapy in 09/2018. Not a candidate for IR intervention due to anatomy. Has received 2 units of prbcs. H/o STEMI s/p stent 03/2018. Initially on Brilinta but more recently on Plavix/ASA. H/H pending  today. No BM in two days.  EGD this admission with moderate gastritis, one non-bleeding cratered duodenal ulcer with visible vessel, s/p epi injection/hemostatic clips.   Leukocytosis ?etiology. Labs pending.    Plan: 1. F/u biopsies. 2. F/U pending labs.  3. Complete 72 hour protonix drip, then protonix bid for one month, then daily. 4. Avoid ASA unless medically necessary. Consider plavix if needed but hold at least until 12/27/2018. 5. Will advance diet today.  6. Return to the office in six months.  7. Will follow peripherally.   [name]. [name] [facility] [phone] 4/2/20209:17 AM  \   LOS: 2 days

## 2018-12-26 NOTE — Progress Notes (Signed)
PROGRESS NOTE    Vincent Savage  FVC:[id number]  DOB: [birth year]  DOA: 12/23/2018 PCP: Dione Housekeeper, MD   Brief Admission Hx: 85 year old gentleman presented to the hospital with recurrent upper GI bleeding likely from a duodenal ulceration seen on repeat EGD 12/24/2018.  MDM/Assessment & Plan:   1. Recurrent upper GI bleeding- suspect secondary to duodenal ulceration treated with clipping on 12/24/2018.  The patient remains on Protonix infusion to complete a 72-hour treatment.  After that he will be on daily Protonix.  He is advancing diet today. 2. Coronary artery disease status post STEMI 04/13/2018-DES to ostial RCA.  Family has decided against resuming blood thinners at this time given recurrent GI bleeds.  He will follow-up with his outpatient cardiologist to discuss further. 3. Leukocytosis-white blood cell count has been trending down.  No signs or symptoms of infection found. 4. Essential hypertension- his blood pressures have been very well controlled while holding amlodipine. 5. Severe aortic stenosis- outpatient follow-up with cardiologist with plans to proceed with a work-up for transcatheter aortic valve replacement.  DVT prophylaxis: SCDs Code Status: Full code Family Communication: Patient updated at bedside. Disposition Plan: Home tomorrow   Consultants:  GI  Procedures:  EGD 12/24/2018.  Antimicrobials:  N/A  Subjective: Patient reports that he would like to eat more.  Objective: Vitals:   12/25/18 2143 12/25/18 2149 12/25/18 2152 12/26/18 0521  BP:  (!) 149/71 (!) 149/71 116/62  Pulse:  (!) 130 (!) 129 83  Resp:  19  18  Temp:  99.4 F (37.4 C) 99.4 F (37.4 C) 98.2 F (36.8 C)  TempSrc:  Oral Oral Oral  SpO2: 94% 96% 96% 99%  Weight:      Height:        Intake/Output Summary (Last 24 hours) at 12/26/2018 0940 Last data filed at 12/26/2018 6384 Gross per 24 hour  Intake 240 ml  Output 750 ml  Net -510 ml   Filed Weights   12/23/18  1108  Weight: 62.6 kg   REVIEW OF SYSTEMS  As per history otherwise all reviewed and reported negative  Exam:  General exam: Elderly male lying in the bed he appears younger than stated age.  He is easily arousable.  He is in no distress.  He is oriented x3. Respiratory system: Clear. No increased work of breathing. Cardiovascular system: Normal S1 & S2 heard. No JVD, murmurs, gallops, clicks or pedal edema. Gastrointestinal system: Abdomen is nondistended, soft and nontender. Normal bowel sounds heard. Central nervous system: Alert and oriented. No focal neurological deficits. Extremities: no CCE.  Data Reviewed: Basic Metabolic Panel: Recent Labs  Lab 12/23/18 1120 12/24/18 0159  NA 137  --   K 3.8  --   CL 107  --   CO2 20*  --   GLUCOSE 200*  --   BUN 56*  --   CREATININE 0.88  --   CALCIUM 8.5*  --   MG  --  1.7   Liver Function Tests: Recent Labs  Lab 12/23/18 1120  AST 18  ALT 9  ALKPHOS 47  BILITOT 0.8  PROT 6.6  ALBUMIN 3.5   No results for input(s): LIPASE, AMYLASE in the last 168 hours. No results for input(s): AMMONIA in the last 168 hours. CBC: Recent Labs  Lab 12/23/18 1120 12/23/18 1854 12/24/18 0159 12/25/18 0455  WBC 23.3*  --  18.0* 17.7*  NEUTROABS  --   --  14.2*  --   HGB 6.2* 7.4* 7.4*  7.5*  HCT 20.0* 23.1* 23.0* 23.9*  MCV 100.0  --  96.6 100.0  PLT 280  --  193 198   Cardiac Enzymes: No results for input(s): CKTOTAL, CKMB, CKMBINDEX, TROPONINI in the last 168 hours. CBG (last 3)  Recent Labs    12/26/18 0022 12/26/18 0407 12/26/18 0733  GLUCAP 102* 125* 95   No results found for this or any previous visit (from the past 240 hour(s)).   Studies: No results found.   Scheduled Meds: . atorvastatin  80 mg Oral q1800  . brimonidine  1 drop Both Eyes QHS  . insulin aspart  0-9 Units Subcutaneous TID WC  . lidocaine  5 mL Mouth/Throat Once  . metoprolol tartrate  12.5 mg Oral BID   Continuous Infusions: . pantoprozole  (PROTONIX) infusion 8 mg/hr (12/26/18 8828)    Active Problems:   Upper GI bleed   Duodenal ulcer with hemorrhage  Time spent:   Irwin Brakeman, MD [facility] 12/26/2018, 9:40 AM    LOS: 2 days  How to contact the [facility] Attending or Consulting provider [place] or covering provider during after hours [place], for this patient?  1. Check the care team in [facility] and look for a) attending/consulting TRH provider listed and b) the [facility] team listed 2. Log into [email or web address] and use [place]'s universal password to access. If you do not have the password, please contact the hospital operator. 3. Locate the [facility] provider you are looking for under [facility] and page to a number that you can be directly reached. 4. If you still have difficulty reaching the provider, please page the [facility] (Director on Call) for the Hospitalists listed on amion for assistance.

## 2018-12-26 NOTE — Progress Notes (Signed)
Physical Therapy Treatment Patient Details Name: Vincent JANSMA Sr. MRN: [id number] DOB: 1918/05/29 Today's Date: 12/26/2018    History of Present Illness Bob Daversa. is a 85 y.o. male with medical history significant for GI bleed, STEMI 03/2018 , who presented with c/o several days of black stools, patient's home health RN also reported coffee-ground emesis today.  He denies abdominal pain.  No chest pain.    PT Comments    Pt sitting on commade upon therapist entrance, friendly and willing to participate.  Pt unable to pass bowel movement this mornin.  No reports of pain and able to respond appropriately to name, DOB and location.  Pt with min guard with transfer training and gait training with RW.  Min cueing for mechanics with transitioning sit to stands for ease and safety.  Pt able to maintain slow labored movements wiht increased distance, no LOB episodes of c/o SOB.  EOS pt left in chair with call bell and telephone wihtin reach and chair alarm set.  No reports of pain through session, was limited by fatigue.   Follow Up Recommendations  Home health PT     Equipment Recommendations  None recommended by PT    Recommendations for Other Services       Precautions / Restrictions Precautions Precautions: Fall    Mobility  Bed Mobility               General bed mobility comments: sitting on commade upon therapist entrance  Transfers Overall transfer level: Modified independent Equipment used: Rolling walker (2 wheeled) Transfers: Sit to/from Stand Sit to Stand: Min guard         General transfer comment: slow labored movement, cueing for mechanics with HHA  Ambulation/Gait Ambulation/Gait assistance: Supervision Gait Distance (Feet): 150 Feet Assistive device: Rolling walker (2 wheeled) Gait Pattern/deviations: Decreased step length - right;Decreased step length - left;Decreased stride length Gait velocity: slightly decreased   General Gait Details:  slightly labored cadence without loss of balance   Stairs             Wheelchair Mobility    Modified Rankin (Stroke Patients Only)       Balance                                            Cognition Arousal/Alertness: Awake/alert Behavior During Therapy: WFL for tasks assessed/performed Overall Cognitive Status: Within Functional Limits for tasks assessed                                        Exercises      General Comments        Pertinent Vitals/Pain Pain Assessment: No/denies pain    Home Living                      Prior Function            PT Goals (current goals can now be found in the care plan section)      Frequency    Min 3X/week      PT Plan Current plan remains appropriate    Co-evaluation              AM-PAC PT "6 Clicks" Mobility   Outcome Measure  Help needed  turning from your back to your side while in a flat bed without using bedrails?: None Help needed moving from lying on your back to sitting on the side of a flat bed without using bedrails?: None Help needed moving to and from a bed to a chair (including a wheelchair)?: A Little Help needed standing up from a chair using your arms (e.g., wheelchair or bedside chair)?: A Little Help needed to walk in hospital room?: A Little Help needed climbing 3-5 steps with a railing? : A Little 6 Click Score: 20    End of Session Equipment Utilized During Treatment: Gait belt Activity Tolerance: Patient tolerated treatment well;Patient limited by fatigue Patient left: in chair;with call bell/phone within reach;with chair alarm set;with nursing/sitter in room   PT Visit Diagnosis: Unsteadiness on feet (R26.81);Other abnormalities of gait and mobility (R26.89);Muscle weakness (generalized) (M62.81)     Time: 3709-6438 PT Time Calculation (min) (ACUTE ONLY): 16 min  Charges:  $Therapeutic Activity: 8-22 mins                      [address], LPTA; CBIS [phone]  Aldona Lento 12/26/2018, 8:59 AM

## 2018-12-26 NOTE — TOC Initial Note (Addendum)
Transition of Care (TOC) - Initial/Assessment Note    Patient Details  Name: Vincent EMMANUEL Sr. MRN: [id number] Date of Birth: 01-09-1918  Transition of Care [facility]) CM/SW Contact:    Sherald Barge, RN Phone Number: 12/26/2018, 1:41 PM  Clinical Narrative:          Active with [facility] pta. PT recommends pt return home with [facility] PT. Pt agreeable to plan for returning home with [facility] PT. CM attempted contact with wife on 4/1 with no answer. Will attempt contact again today or with other family to verify DC plan.        Update: CM spoke with wife, Vincent Savage, she excited for patient to return home. She is agreeable to DC plan. Sons are major support systems for both pt and his wife.   Expected Discharge Plan: [place]     Patient Goals and CMS Choice Patient states their goals for this hospitalization and ongoing recovery are:: Continue services previously in place      Expected Discharge Plan and Services Expected Discharge Plan: [place]       Living arrangements for the past 2 months: Single Family Home                     HH Arranged: PT, RN [place] Agency: [place]  Prior Living Arrangements/Services Living arrangements for the past 2 months: Single Family Home Lives with:: Spouse Patient language and need for interpreter reviewed:: Yes Do you feel safe going back to the place where you live?: Yes      Need for Family Participation in Patient Care: Yes (Comment) Care giver support system in place?: Yes (comment) Current home services: Home PT, Home RN Criminal Activity/Legal Involvement Pertinent to Current Situation/Hospitalization: No - Comment as needed  Activities of Daily Living Home Assistive Devices/Equipment: Gilford Rile (specify type) ADL Screening (condition at time of admission) Patient's cognitive ability adequate to safely complete daily activities?: Yes Is the patient deaf or have difficulty hearing?:  No Does the patient have difficulty seeing, even when wearing glasses/contacts?: No Does the patient have difficulty concentrating, remembering, or making decisions?: No Patient able to express need for assistance with ADLs?: Yes Does the patient have difficulty dressing or bathing?: No Independently performs ADLs?: Yes (appropriate for developmental age) Does the patient have difficulty walking or climbing stairs?: No Weakness of Legs: None Weakness of Arms/Hands: None  Permission Sought/Granted Permission sought to share information with : Chartered certified accountant granted to share information with : Yes, Verbal Permission Granted        Permission granted to share info w Relationship: Wife     Emotional Assessment Appearance:: Appears younger than stated age Attitude/Demeanor/Rapport: Other (comment)(appropriate) Affect (typically observed): Appropriate, Pleasant Orientation: : Oriented to Self, Oriented to Place, Oriented to  Time, Oriented to Situation Alcohol / Substance Use: Not Applicable    Admission diagnosis:  Upper GI bleed [K92.2] Patient Active Problem List   Diagnosis Date Noted  . Duodenal ulcer with hemorrhage   . Upper GI bleed 12/23/2018  . STEMI (ST elevation myocardial infarction) ([place]) 04/13/2018  . ACS (acute coronary syndrome) ([place]) 04/13/2018  . ST elevation myocardial infarction involving right coronary artery ([place]) 04/13/2018  . Atherosclerosis of native arteries of the extremities with ulceration ([place]) 11/28/2017  . Esophageal reflux 03/24/2011  . Iron deficiency anemia, unspecified 03/24/2011  . Dysphagia 03/24/2011  . B12 deficiency 03/24/2011  . Increased storage iron  03/24/2011  . AS (aortic stenosis) 03/24/2011  . HEMATURIA, HX OF 11/29/2009  . BEN HTN HEART DISEASE WITHOUT HEART FAIL 10/27/2009  . AORTIC VALVE DISORDERS 10/27/2009  . OTHER DYSPNEA AND RESPIRATORY ABNORMALITIES 10/27/2009   PCP:  Dione Housekeeper,  MD Pharmacy:   [facility] [place] [address] Phone: [phone] Fax: [phone]     Social Determinants of Health (SDOH) Interventions    Readmission Risk Interventions No flowsheet data found.

## 2018-12-27 ENCOUNTER — Telehealth: Payer: Self-pay | Admitting: Nurse Practitioner

## 2018-12-27 DIAGNOSIS — D649 Anemia, unspecified: Secondary | ICD-10-CM

## 2018-12-27 DIAGNOSIS — D500 Iron deficiency anemia secondary to blood loss (chronic): Secondary | ICD-10-CM

## 2018-12-27 LAB — BPAM RBC
Blood Product Expiration Date: 202004152359
Blood Product Expiration Date: 202004182359
Blood Product Expiration Date: 202004182359
ISSUE DATE / TIME: 202003301442
ISSUE DATE / TIME: 202003302236
UNIT TYPE AND RH: 6200
UNIT TYPE AND RH: 6200
Unit Type and Rh: 6200

## 2018-12-27 LAB — TYPE AND SCREEN
ABO/RH(D): AB POS
ANTIBODY SCREEN: NEGATIVE
UNIT DIVISION: 0
Unit division: 0
Unit division: 0

## 2018-12-27 LAB — GLUCOSE, CAPILLARY
Glucose-Capillary: 124 mg/dL — ABNORMAL HIGH (ref 70–99)
Glucose-Capillary: 141 mg/dL — ABNORMAL HIGH (ref 70–99)
Glucose-Capillary: 146 mg/dL — ABNORMAL HIGH (ref 70–99)
Glucose-Capillary: 160 mg/dL — ABNORMAL HIGH (ref 70–99)

## 2018-12-27 MED ORDER — PANTOPRAZOLE SODIUM 40 MG PO TBEC
40.0000 mg | DELAYED_RELEASE_TABLET | Freq: Two times a day (BID) | ORAL | Status: DC
  Administered 2018-12-27: 40 mg via ORAL
  Filled 2018-12-27: qty 1

## 2018-12-27 MED ORDER — ATORVASTATIN CALCIUM 80 MG PO TABS: 40.0000 mg | ORAL_TABLET | Freq: Every day | ORAL | Status: AC

## 2018-12-27 NOTE — Telephone Encounter (Signed)
Correction to previous phone note: please schedule 6 month hosp follow-up

## 2018-12-27 NOTE — TOC Transition Note (Signed)
Transition of Care [facility]) - CM/SW Discharge Note   Patient Details  Name: Vincent MELICHAR Sr. MRN: [id number] Date of Birth: [birth year]  Transition of Care [facility]) CM/SW Contact:  Sherald Barge, RN Phone Number: 12/27/2018, 9:03 AM   Clinical Narrative:   [facility] [employer] rep, Rema Jasmine, aware of DC today.     Final next level of care: [place]     Patient Goals and CMS Choice Patient states their goals for this hospitalization and ongoing recovery are:: Continue services previously in place        [facility] Arranged: PT, RN [facility] Agency: [place]   Readmission Risk Interventions Readmission Risk Prevention Plan 12/27/2018  Post Dischage Appt Complete  Medication Screening Complete  Transportation Screening Complete  Some recent data might be hidden

## 2018-12-27 NOTE — Telephone Encounter (Signed)
Please schedule 3 month hospital follow-up with any APP. Thanks!

## 2018-12-27 NOTE — Discharge Instructions (Signed)
Please stop taking aspirin.   Please stop taking plavix (clopidogrel) for now and discuss with your primary doctors and cardiologist if it is safe to restart.    Gastrointestinal Bleeding  Gastrointestinal bleeding is bleeding somewhere along the path food travels through the body (digestive tract). This path is anywhere between the mouth and the opening of the butt (anus). You may have blood in your poop (stools) or have black poop. If you throw up (vomit), there may be blood in it. This condition can be mild, serious, or even life-threatening. If you have a lot of bleeding, you may need to stay in the hospital. Follow these instructions at home:  Take over-the-counter and prescription medicines only as told by your doctor.  Eat foods that have a lot of fiber in them. These foods include whole grains, fruits, and vegetables. You can also try eating 1-3 prunes each day.  Drink enough fluid to keep your pee (urine) clear or pale yellow.  Keep all follow-up visits as told by your doctor. This is important. Contact a doctor if:  Your symptoms do not get better. Get help right away if:  Your bleeding gets worse.  You feel dizzy or you pass out (faint).  You feel weak.  You have very bad cramps in your back or belly (abdomen).  You pass large clumps of blood (clots) in your poop.  Your symptoms are getting worse. This information is not intended to replace advice given to you by your health care provider. Make sure you discuss any questions you have with your health care provider. Document Released: 06/20/2008 Document Revised: 02/17/2016 Document Reviewed: 03/01/2015 Elsevier Interactive Patient Education  2019 [place].   Gastrointestinal Bleeding Scan A gastrointestinal bleeding scan is a procedure that is used to locate sites of bleeding in the bowel. You may need this scan if you have symptoms of gastrointestinal bleeding, such as passing bloody stools (feces), or passing  black, tarry stools. The scan may also be used to correct bleeding in these areas before surgery is done. For this scan, a small amount of a radioactive material (tracer) is injected into your blood. A scanner with a camera that detects the radioactive tracer will show if any of the material gets into your abdomen or bowel. If it does, this indicates the site of bleeding. Tell a health care provider about:  Any allergies you have.  All medicines you are taking, including vitamins, herbs, eye drops, creams, and over-the-counter medicines.  Any blood disorders you have.  Any surgeries you have had.  Any medical conditions you have.  Whether you are pregnant or may be pregnant.  Whether you are breastfeeding. What are the risks? Generally, this is a safe procedure. However, problems may occur, including:  Exposure to radiation (a small amount of it).  Allergic reaction to the radioactive tracer. This is rare, but it may include swelling of the lips or tongue and difficulty breathing. What happens before the procedure?  Ask your health care provider about changing or stopping your regular medicines.  Follow instructions from your health care provider about eating or drinking restrictions. What happens during the procedure?   An IV will be inserted into one of your veins.  A small amount of radioactive tracer will be injected through the IV. Some of your blood may be drawn and mixed with the radioactive tracer before it is injected through the IV.  As the radioactive tracer travels through your bloodstream, images of your abdomen will  be taken. These images will be taken at short intervals of 5-15 minutes during the procedure. If the images show radioactive tracer in the abdomen or bowel, this indicates the site of bleeding.  Additional images may be taken every hour for up to 24 hours after the injection of the radioactive tracer. This may be needed to help identify a site of slow  bleeding or bleeding that comes and goes. The procedure may vary among health care providers and hospitals. What can I expect after the procedure?   Return to your normal activities and diet as told by your health care provider.  The radioactive tracer will leave your body over the next few days. Drink enough fluid to keep your urine pale yellow. This will help flush the tracer out of your body.  It is up to you to get the results of your procedure. Ask your health care provider, or the department that is doing the procedure, when and how you will get your results. Summary  A gastrointestinal bleeding scan is a procedure that is used to locate sites of bleeding in the bowel.  You may need this scan if you have symptoms of gastrointestinal bleeding, such as passing bloody stools or passing black, tarry stools.  For this scan, a small amount of a radioactive material (tracer) is injected into your blood. A scanner with a camera that detects the radioactive tracer will show if any of the material gets into your abdomen or bowel. If it does, this indicates the site of bleeding.  Ask your health care provider if you can take your regular medicines before the procedure and whether you have diet restrictions.  Ask your health care provider, or the department that is doing the procedure, when and how you will get your results. This information is not intended to replace advice given to you by your health care provider. Make sure you discuss any questions you have with your health care provider. Document Released: 10/31/2005 Document Revised: 08/22/2017 Document Reviewed: 08/22/2017 Elsevier Interactive Patient Education  2019 [employer] Inc[employer]     IMPORTANT INFORMATION: PAY CLOSE ATTENTION   PHYSICIAN DISCHARGE INSTRUCTIONS  Follow with Primary care provider  Dione Housekeeper, MD  and other consultants as instructed your Hospitalist Physician  SEEK MEDICAL CARE OR RETURN TO EMERGENCY ROOM IF  SYMPTOMS COME BACK, WORSEN OR NEW PROBLEM DEVELOPS.   Please note: You were cared for by a hospitalist during your hospital stay. Every effort will be made to forward records to your primary care provider.  You can request that your primary care provider send for your hospital records if they have not received them.  Once you are discharged, your primary care physician will handle any further medical issues. Please note that NO REFILLS for any discharge medications will be authorized once you are discharged, as it is imperative that you return to your primary care physician (or establish a relationship with a primary care physician if you do not have one) for your post hospital discharge needs so that they can reassess your need for medications and monitor your lab values.  Please get a complete blood count and chemistry panel checked by your Primary MD at your next visit, and again as instructed by your Primary MD.  Get Medicines reviewed and adjusted: Please take all your medications with you for your next visit with your Primary MD  Laboratory/radiological data: Please request your Primary MD to go over all hospital tests and procedure/radiological results at the follow up,  please ask your primary care provider to get all Hospital records sent to his/her office.  In some cases, they will be blood work, cultures and biopsy results pending at the time of your discharge. Please request that your primary care provider follow up on these results.  If you are diabetic, please bring your blood sugar readings with you to your follow up appointment with primary care.    Please call and make your follow up appointments as soon as possible.    Also Note the following: If you experience worsening of your admission symptoms, develop shortness of breath, life threatening emergency, suicidal or homicidal thoughts you must seek medical attention immediately by calling 911 or calling your MD immediately  if  symptoms less severe.  You must read complete instructions/literature along with all the possible adverse reactions/side effects for all the Medicines you take and that have been prescribed to you. Take any new Medicines after you have completely understood and accpet all the possible adverse reactions/side effects.   Do not drive when taking Pain medications or sleeping medications (Benzodiazepines)  Do not take more than prescribed Pain, Sleep and Anxiety Medications. It is not advisable to combine anxiety,sleep and pain medications without talking with your primary care practitioner  Special Instructions: If you have smoked or chewed Tobacco  in the last 2 yrs please stop smoking, stop any regular Alcohol  and or any Recreational drug use.  Wear Seat belts while driving.

## 2018-12-27 NOTE — Care Management Important Message (Signed)
Important Message  Patient Details  Name: Vincent VERDE Sr. MRN: [id number] Date of Birth: 1918-04-30   Medicare Important Message Given:  Yes    Sherald Barge, RN 12/27/2018, 2:45 PM

## 2018-12-27 NOTE — Progress Notes (Signed)
Physical Therapy Treatment Patient Details Name: Vincent RADLE Sr. MRN: [id number] DOB: 07-Apr-1918 Today's Date: 12/27/2018    History of Present Illness Vincent Wainer. is a 85 y.o. male with medical history significant for GI bleed, STEMI 03/2018 , who presented with c/o several days of black stools, patient's home health RN also reported coffee-ground emesis today.  He denies abdominal pain.  No chest pain.    PT Comments     Pt sitting on side of bed upon therapist entrance, friendly and willing to participate.  Pt with supervision/min guard with mobility and SBA with gait training today.  Able to ambulate 150 feet safely with RW, no LOB episodes through session.  EOS pt reports fatigue and requested to return to bed.  Pt educated on importance of good mechanics with sit and standing including use of RW and hand placement prior movement to reduce risk of fall.  No reports of pain through session.      Follow Up Recommendations  Home health PT     Equipment Recommendations  None recommended by PT    Recommendations for Other Services       Precautions / Restrictions Precautions Precautions: Fall    Mobility  Bed Mobility Overal bed mobility: Modified Independent Bed Mobility: Sit to Supine(sit to supine at EOS)       Sit to supine: Supervision   General bed mobility comments: sitting on EOB upon therapist entrance, requested to return to bed following gait  Transfers Overall transfer level: Modified independent Equipment used: Rolling walker (2 wheeled) Transfers: Sit to/from Stand Sit to Stand: Min guard         General transfer comment: slow labored movement, cueing for mechanics with HHA  Ambulation/Gait Ambulation/Gait assistance: Supervision Gait Distance (Feet): 150 Feet Assistive device: Rolling walker (2 wheeled) Gait Pattern/deviations: Decreased step length - right;Decreased step length - left;Decreased stride length Gait velocity: slightly  decreased   General Gait Details: slightly labored cadence without loss of balance   Stairs             Wheelchair Mobility    Modified Rankin (Stroke Patients Only)       Balance                                            Cognition Arousal/Alertness: Awake/alert Behavior During Therapy: WFL for tasks assessed/performed Overall Cognitive Status: Within Functional Limits for tasks assessed                                        Exercises      General Comments        Pertinent Vitals/Pain Pain Assessment: No/denies pain    Home Living                      Prior Function            PT Goals (current goals can now be found in the care plan section) Progress towards PT goals: Progressing toward goals    Frequency    Min 3X/week      PT Plan Current plan remains appropriate    Co-evaluation              AM-PAC PT "6 Clicks" Mobility  Outcome Measure  Help needed turning from your back to your side while in a flat bed without using bedrails?: None Help needed moving from lying on your back to sitting on the side of a flat bed without using bedrails?: None Help needed moving to and from a bed to a chair (including a wheelchair)?: A Little Help needed standing up from a chair using your arms (e.g., wheelchair or bedside chair)?: A Little Help needed to walk in hospital room?: A Little Help needed climbing 3-5 steps with a railing? : A Little 6 Click Score: 20    End of Session Equipment Utilized During Treatment: Gait belt Activity Tolerance: Patient tolerated treatment well;Patient limited by fatigue Patient left: in bed;with call bell/phone within reach;with bed alarm set Nurse Communication: Mobility status(RN aware of status) PT Visit Diagnosis: Unsteadiness on feet (R26.81);Other abnormalities of gait and mobility (R26.89);Muscle weakness (generalized) (M62.81)     Time: 8453-6468 PT Time  Calculation (min) (ACUTE ONLY): 21 min  Charges:  $Therapeutic Activity: 8-22 mins                     [address], LPTA; CBIS [phone]  Aldona Lento 12/27/2018, 9:25 AM

## 2018-12-27 NOTE — Plan of Care (Signed)

## 2018-12-27 NOTE — Progress Notes (Signed)
    Subjective: Feeling good, wants to go home. Tolerating diet well. Denies N/V. No BM in 24 hours. No other GI complaints.  Objective: Vital signs in last 24 hours: Temp:  [97.7 F (36.5 C)-99.1 F (37.3 C)] 97.7 F (36.5 C) (04/03 0517) Pulse Rate:  [81-117] 117 (04/03 0517) Resp:  [16-18] 16 (04/03 0517) BP: (121-147)/(60-69) 139/69 (04/03 0517) SpO2:  [96 %-100 %] 96 % (04/03 0742) Last BM Date: 12/24/18 General:   Alert and oriented, pleasant Head:  Normocephalic and atraumatic. Eyes:  No icterus, sclera clear. Conjuctiva pink.  Heart:  S1, S2 present, murmur noted.  Lungs: Clear to auscultation bilaterally, without wheezing, rales, or rhonchi.  Abdomen:  Bowel sounds present, soft, non-tender, non-distended. No HSM or hernias noted. No rebound or guarding. No masses appreciated  Msk:  Symmetrical without gross deformities. Extremities:  Without clubbing or edema. Neurologic:  Alert and  oriented x4;  grossly normal neurologically. Psych:  Alert and cooperative. Normal mood and affect.  Intake/Output from previous day: 04/02 0701 - 04/03 0700 In: 1017 [P.O.:960; I.V.:57] Out: 1350 [Urine:1350] Intake/Output this shift: No intake/output data recorded.  Lab Results: Recent Labs    12/25/18 0455 12/26/18 0943  WBC 17.7* 14.6*  HGB 7.5* 7.7*  HCT 23.9* 25.1*  PLT 198 222   BMET No results for input(s): NA, K, CL, CO2, GLUCOSE, BUN, CREATININE, CALCIUM in the last 72 hours. LFT No results for input(s): PROT, ALBUMIN, AST, ALT, ALKPHOS, BILITOT, BILIDIR, IBILI in the last 72 hours. PT/INR No results for input(s): LABPROT, INR in the last 72 hours. Hepatitis Panel No results for input(s): HEPBSAG, HCVAB, HEPAIGM, HEPBIGM in the last 72 hours.   Studies/Results: No results found.  Assessment: 85 y/o male presenting with UBI bleed and acute blood loss anemia in setting of known PUD requiring bleeding control therapy in 09/2018. Not a candidate for IR intervention  due to anatomy. Has received 2 units of prbcs. H/o STEMI s/p stent 03/2018. Initially on Brilinta but more recently on Plavix/ASA.   EGD this admission with moderate gastritis, one non-bleeding cratered duodenal ulcer with visible vessel, s/p epi injection/hemostatic clips.  Surgical pathology back and showed reactive gastropathy with erosions, negative for H. pylori.  Leukocytosis ?etiology. Follow-up labs showed improvement with WBC count down from 17.7 to 14.6 yesterday. Hgb yesterday with stability over the past 4 days (7.4, 7.5, 7.7). Diet advanced and tolerating well. No BM recently. Clinically feels well.  Completed PPI gtt yesterday evening, now on bid PPI.    Plan: 1. Protonix bid x 3 months at discharge 2. NSAID and ASA avoidance unless necessary (consider Plavix as alternative for cardiac history after discussion with [facility] cardiology) 3. 6 month outpatient follow-up 4. Supportive measures 5. We will sign off for now. Please contact us is further assistance is needed.   Thank you for allowing [place] to participate in the care of Vincent Siren Sr.  Vincent Field, DNP, AGNP-C Adult & Gerontological Nurse Practitioner [facility]     LOS: 3 days    12/27/2018, 7:53 AM

## 2018-12-27 NOTE — Discharge Summary (Addendum)
Physician Discharge Summary  Vincent Savage XTG:[id number] DOB: 12/23/1917 DOA: 12/23/2018  PCP: Dione Housekeeper, MD Cardiologist: Clovis Riley ([place])  Admit date: 12/23/2018 Discharge date: 12/27/2018  Admitted From:  Home  Disposition: Home with [place]  Recommendations for Outpatient Follow-up:  1. Follow up with PCP in 1 weeks 2. Follow up with cardiology in 2 weeks 3. Follow up with GI in 6 months 4. Continue protonix 40 mg BID x 3 months then once daily 5. Please AVOID ASPIRIN INDEFINITELY, if medically necessary, GI recommends plavix (clopidogrel), to be discussed with PCP and cardiologist.  Family and patient deciding to avoid all blood thinners at this time. 6. Please obtain CBC in 1-2 weeks to follow up Hemoglobin level  Home Health:  PT, RN  Discharge Condition: STABLE   CODE STATUS: FULL    Brief Hospitalization Summary: Please see all hospital notes, images, labs for full details of the hospitalization. Dr. Talmadge Coventry HPI: Vincent Savage Sr. is a 85 y.o. male with medical history significant for GI bleed, STEMI 03/2018 , who presented with c/o several days of black stools, patient's home health RN also reported coffee-ground emesis today.  He denies abdominal pain.  No chest pain. He supposed to be on antiplatelet for STEMI.  He tells me his Brilinta was changed to Plavix and subsequently changed to another medication he is unaware of the name.  He tells me he did not take his medications yesterday.  Per care everywhere, patient was admitted at [facility] for GI bleed.  He had EGD 1/24 and 1/29.  EGD showed multiple gastric and duodenal bulb ulcers, with visible vessel, requiring epi injection, hemoclipping x 3.  Patient's was also evaluated by IR for embolization but they were unable to embolize due to aberrant anatomy.  At that admission patient required 7 units of PRBC. Patient subsequently followed up with his cardiologist 12/04/18, for some reason  patient had not been taking his Brilinta.  He is dual antiplatelet aspirin and Brilinta with changed from Plavix.  He reports some mild lower abdominal pain, denies burning with urination.  Patient was also prescribed Keflex for an abscess on his back 3/3.  No neck pain.  Reports he has been home, no sick contacts, no travel, no nasal congestion sore throat or myalgias, no cough or difficulty breathing.  Patient at baseline despite his age has good functional status, and drives.  ED Course: Intermittent tachycardia to 118, blood pressure systolic 270J to 500X.  Hgb 6.2, with normal platelets 280. significant leukocytosis 23.  BUN elevated 56.  Patient given 1 unit of blood in ED.  Second unit of blood was declined as there is scarcity of blood transfusion products, this patient's hemoglobin was less than 7.  Hospitalist to admit for GI bleed.  Brief Admission Hx: 85 year old gentleman presented to the hospital with recurrent upper GI bleeding likely from a duodenal ulceration seen on repeat EGD 12/24/2018.  MDM/Assessment & Plan:   1. Recurrent upper GI bleeding- suspect secondary to duodenal ulceration treated with clipping on 12/24/2018.  The patient remains on Protonix infusion to complete a 72-hour treatment.  After that he will be on daily Protonix.  He is advancing diet today.  Per GI team patient is not a candidate for NGO/embolization per radiology January 2020 and [facility].  If he rebleeds or develops a transfusion dependent anemia would repeat EGD for epi/cautery/additional clips. 2. Coronary artery disease status post STEMI 04/13/2018-DES to ostial RCA.  Family has  decided against resuming blood thinners at this time given recurrent GI bleeds.  He will follow-up with his outpatient PCP and cardiologist to discuss further. 3. Leukocytosis-white blood cell count has been trending down.  No signs or symptoms of infection found. 4. Essential hypertension- holding amlodipine, resume  metoprolol.  Follow up with PCP for recheck. 5. Severe aortic stenosis- outpatient follow-up with cardiologist with plans to proceed with a work-up for transcatheter aortic valve replacement.  6. Chronic diastolic heart failure  DVT prophylaxis: SCDs Code Status: Full code Family Communication: wife (telephone), patient updated at bedside. Disposition Plan: Home with home health services  Consultants:  GI  Procedures:  EGD 12/24/2018. Impression:       - Barrett's esophagus NOT BIOPSIED                           - MODERATE Gastritis. Biopsied.                           - MELENA/GI BLEED DUE TO duodenal ulcer with a visible vessel.                           - Medium-sized hiatal hernia. Moderate Sedation:      Per Anesthesia Care Recommendation:                - Return patient to hospital ward for ongoing care.                           - Full liquid diet. CBC IN AM.                           - Continue present medications. PROTONIX GTT FOR 72                            HRS THEN BID FOR ONE MONTH THEN ONCE DAILY. HOLD                            ASA FOR 7 DAYS. MAY RE-START PLAVIX IN 72 HOURS IF                            MEDICALLY NECESSARY.                           - Await pathology results.                           - Return to my office in 6 months.                           - PT IS NOT A CANDIDATE FOR ANGIO/EMBOLIZATION PER                            RADIOLOGY JAN 2020 [place]. IF HE REBELEEDS                            OR DEVELOPS A TRANSFUSION DEPENDENT ANEMIA, WOULD  REPEAT EGD FOR EPI/CAUTERY/ADDITIONAL CLIPS. Discharge Diagnoses:  Active Problems:   Upper GI bleed   Duodenal ulcer with hemorrhage   Discharge Instructions: Discharge Instructions    Call MD for:  difficulty breathing, headache or visual disturbances   Complete by:  As directed    Call MD for:  persistant dizziness or light-headedness   Complete by:  As directed     Call MD for:  persistant nausea and vomiting   Complete by:  As directed    Call MD for:  severe uncontrolled pain   Complete by:  As directed    Increase activity slowly   Complete by:  As directed      Allergies as of 12/27/2018      Reactions   Ciprofloxacin Other (See Comments)   REACTION: Questions rx d/t feeling bad that relieved after stopping Cipro      Medication List    STOP taking these medications   amLODipine 10 MG tablet Commonly known as:  NORVASC   aspirin 81 MG tablet   cephALEXin 500 MG capsule Commonly known as:  KEFLEX   clopidogrel 75 MG tablet Commonly known as:  PLAVIX   omeprazole 20 MG capsule Commonly known as:  PRILOSEC   sucralfate 1 GM/10ML suspension Commonly known as:  CARAFATE   ticagrelor 90 MG Tabs tablet Commonly known as:  BRILINTA     TAKE these medications   atorvastatin 80 MG tablet Commonly known as:  LIPITOR Take 0.5 tablets (40 mg total) by mouth daily at 6 PM.   brimonidine 0.2 % ophthalmic solution Commonly known as:  ALPHAGAN Place 1 drop into both eyes at bedtime.   cyanocobalamin 1000 MCG/ML injection Commonly known as:  (VITAMIN B-12) Patient wishes to have b12 injection done at Dr [name] office I have advised him to call and set these up since I do not know the office policy.  Inject one ML IM once a week for three weeks, then inject one ML Im once a month for one year. Then Patient needs repeat labs done to recheck his b12 level. What changed:    how much to take  how to take this  when to take this  additional instructions   escitalopram 10 MG tablet Commonly known as:  LEXAPRO Take 10 mg by mouth at bedtime.   fluticasone 50 MCG/ACT nasal spray Commonly known as:  FLONASE Place 2 sprays into both nostrils daily as needed for allergies or rhinitis.   furosemide 20 MG tablet Commonly known as:  LASIX Take 20 mg by mouth every other day.   latanoprost 0.005 % ophthalmic solution Commonly known  as:  XALATAN Place 1 drop into both eyes at bedtime.   metoprolol tartrate 25 MG tablet Commonly known as:  LOPRESSOR Take 1 tablet (25 mg total) by mouth 2 (two) times daily.   nitroGLYCERIN 0.4 MG SL tablet Commonly known as:  Nitrostat Place 1 tablet (0.4 mg total) under the tongue every 5 (five) minutes as needed.   ondansetron 4 MG tablet Commonly known as:  ZOFRAN Take 4 mg by mouth daily as needed for nausea or vomiting.   pantoprazole 40 MG tablet Commonly known as:  PROTONIX Take 40 mg by mouth 2 (two) times daily.   potassium chloride 10 MEQ tablet Commonly known as:  K-DUR Take 10 mEq by mouth 2 (two) times daily.   Proventil HFA 108 (90 Base) MCG/ACT inhaler Generic drug:  albuterol Inhale 2 puffs into the lungs every 6 (  six) hours as needed for wheezing or shortness of breath.   Tylenol Arthritis Pain 650 MG CR tablet Generic drug:  acetaminophen Take 650 mg by mouth 2 (two) times daily.      Follow-up Information    Dione Housekeeper, MD. Schedule an appointment as soon as possible for a visit in 1 week(s).   Specialty:  Family Medicine Why:  Hospital Follow Up  Contact information: [place] [address] [phone]        Daneil Dolin, MD. Schedule an appointment as soon as possible for a visit in 6 month(s).   Specialty:  Gastroenterology Why:  Hospital Follow Up  Contact information: [address] [place] [address] [phone]        Crissie Reese., MD. Schedule an appointment as soon as possible for a visit in 2 week(s).   Specialty:  Internal Medicine Why:  Hospital follow-up Contact information: [address] [place] [address] [phone]          Allergies  Allergen Reactions  . Ciprofloxacin Other (See Comments)    REACTION: Questions rx d/t feeling bad that relieved after stopping Cipro    Allergies as of 12/27/2018      Reactions   Ciprofloxacin Other (See Comments)   REACTION:  Questions rx d/t feeling bad that relieved after stopping Cipro      Medication List    STOP taking these medications   amLODipine 10 MG tablet Commonly known as:  NORVASC   aspirin 81 MG tablet   cephALEXin 500 MG capsule Commonly known as:  KEFLEX   clopidogrel 75 MG tablet Commonly known as:  PLAVIX   omeprazole 20 MG capsule Commonly known as:  PRILOSEC   sucralfate 1 GM/10ML suspension Commonly known as:  CARAFATE   ticagrelor 90 MG Tabs tablet Commonly known as:  BRILINTA     TAKE these medications   atorvastatin 80 MG tablet Commonly known as:  LIPITOR Take 0.5 tablets (40 mg total) by mouth daily at 6 PM.   brimonidine 0.2 % ophthalmic solution Commonly known as:  ALPHAGAN Place 1 drop into both eyes at bedtime.   cyanocobalamin 1000 MCG/ML injection Commonly known as:  (VITAMIN B-12) Patient wishes to have b12 injection done at Dr [name] office I have advised him to call and set these up since I do not know the office policy.  Inject one ML IM once a week for three weeks, then inject one ML Im once a month for one year. Then Patient needs repeat labs done to recheck his b12 level. What changed:    how much to take  how to take this  when to take this  additional instructions   escitalopram 10 MG tablet Commonly known as:  LEXAPRO Take 10 mg by mouth at bedtime.   fluticasone 50 MCG/ACT nasal spray Commonly known as:  FLONASE Place 2 sprays into both nostrils daily as needed for allergies or rhinitis.   furosemide 20 MG tablet Commonly known as:  LASIX Take 20 mg by mouth every other day.   latanoprost 0.005 % ophthalmic solution Commonly known as:  XALATAN Place 1 drop into both eyes at bedtime.   metoprolol tartrate 25 MG tablet Commonly known as:  LOPRESSOR Take 1 tablet (25 mg total) by mouth 2 (two) times daily.   nitroGLYCERIN 0.4 MG SL tablet Commonly known as:  Nitrostat Place 1 tablet (0.4 mg total) under the tongue every 5  (five) minutes as needed.  ondansetron 4 MG tablet Commonly known as:  ZOFRAN Take 4 mg by mouth daily as needed for nausea or vomiting.   pantoprazole 40 MG tablet Commonly known as:  PROTONIX Take 40 mg by mouth 2 (two) times daily.   potassium chloride 10 MEQ tablet Commonly known as:  K-DUR Take 10 mEq by mouth 2 (two) times daily.   Proventil HFA 108 (90 Base) MCG/ACT inhaler Generic drug:  albuterol Inhale 2 puffs into the lungs every 6 (six) hours as needed for wheezing or shortness of breath.   Tylenol Arthritis Pain 650 MG CR tablet Generic drug:  acetaminophen Take 650 mg by mouth 2 (two) times daily.      Procedures/Studies: No results found.   Subjective: Patient says he feels much better and he has had no further rectal bleeding, no further black stools and tolerating diet.  Discharge Exam: Vitals:   12/27/18 0517 12/27/18 0742  BP: 139/69   Pulse: (!) 117   Resp: 16   Temp: 97.7 F (36.5 C)   SpO2: 97% 96%   Vitals:   12/26/18 1936 12/26/18 2102 12/27/18 0517 12/27/18 0742  BP:  (!) 147/69 139/69   Pulse:  96 (!) 117   Resp:  16 16   Temp:  99.1 F (37.3 C) 97.7 F (36.5 C)   TempSrc:  Oral Oral   SpO2: 97% 96% 97% 96%  Weight:      Height:       General: Pt is alert, awake, not in acute distress Cardiovascular: normal S1/S2 +, no rubs, no gallops Respiratory: CTA bilaterally, no wheezing, no rhonchi Abdominal: Soft, NT, ND, bowel sounds + Extremities: no edema, no cyanosis   The results of significant diagnostics from this hospitalization (including imaging, microbiology, ancillary and laboratory) are listed below for reference.     Microbiology: No results found for this or any previous visit (from the past 240 hour(s)).   Labs: BNP (last 3 results) No results for input(s): BNP in the last 8760 hours. Basic Metabolic Panel: Recent Labs  Lab 12/23/18 1120 12/24/18 0159  NA 137  --   K 3.8  --   CL 107  --   CO2 20*  --    GLUCOSE 200*  --   BUN 56*  --   CREATININE 0.88  --   CALCIUM 8.5*  --   MG  --  1.7   Liver Function Tests: Recent Labs  Lab 12/23/18 1120  AST 18  ALT 9  ALKPHOS 47  BILITOT 0.8  PROT 6.6  ALBUMIN 3.5   No results for input(s): LIPASE, AMYLASE in the last 168 hours. No results for input(s): AMMONIA in the last 168 hours. CBC: Recent Labs  Lab 12/23/18 1120 12/23/18 1854 12/24/18 0159 12/25/18 0455 12/26/18 0943  WBC 23.3*  --  18.0* 17.7* 14.6*  NEUTROABS  --   --  14.2*  --  11.2*  HGB 6.2* 7.4* 7.4* 7.5* 7.7*  HCT 20.0* 23.1* 23.0* 23.9* 25.1*  MCV 100.0  --  96.6 100.0 101.2*  PLT 280  --  193 198 222   Cardiac Enzymes: No results for input(s): CKTOTAL, CKMB, CKMBINDEX, TROPONINI in the last 168 hours. BNP: Invalid input(s): POCBNP CBG: Recent Labs  Lab 12/26/18 1623 12/26/18 1953 12/27/18 0001 12/27/18 0359 12/27/18 0734  GLUCAP 130* 141* 124* 160* 141*   D-Dimer No results for input(s): DDIMER in the last 72 hours. Hgb A1c No results for input(s): HGBA1C in the last 72  hours. Lipid Profile No results for input(s): CHOL, HDL, LDLCALC, TRIG, CHOLHDL, LDLDIRECT in the last 72 hours. Thyroid function studies No results for input(s): TSH, T4TOTAL, T3FREE, THYROIDAB in the last 72 hours.  Invalid input(s): FREET3 Anemia work up No results for input(s): VITAMINB12, FOLATE, FERRITIN, TIBC, IRON, RETICCTPCT in the last 72 hours. Urinalysis    Component Value Date/Time   COLORURINE YELLOW 12/23/2018 2041   APPEARANCEUR HAZY (A) 12/23/2018 2041   LABSPEC 1.016 12/23/2018 2041   PHURINE 5.0 12/23/2018 2041   GLUCOSEU NEGATIVE 12/23/2018 2041   HGBUR MODERATE (A) 12/23/2018 2041   [place] NEGATIVE 12/23/2018 2041   [place] 12/23/2018 2041   PROTEINUR NEGATIVE 12/23/2018 2041   UROBILINOGEN 0.2 07/27/2012 1633   NITRITE NEGATIVE 12/23/2018 2041   LEUKOCYTESUR TRACE (A) 12/23/2018 2041   Sepsis Labs Invalid input(s):  PROCALCITONIN,  WBC,  LACTICIDVEN Microbiology No results found for this or any previous visit (from the past 240 hour(s)).  Time coordinating discharge: 33 minutes   SIGNED:  Irwin Brakeman, MD  [facility] 12/27/2018, 9:00 AM How to contact the [facility] Attending or Consulting provider [place] or covering provider during after hours [place], for this patient?  1. Check the care team in [facility] and look for a) attending/consulting [facility] provider listed and b) the [facility] team listed 2. Log into [email or web address] and use [place]'s universal password to access. If you do not have the password, please contact the hospital operator. 3. Locate the [facility] provider you are looking for under [facility] and page to a number that you can be directly reached. 4. If you still have difficulty reaching the provider, please page the [facility] (Director on Call) for the Hospitalists listed on amion for assistance.

## 2018-12-30 ENCOUNTER — Encounter: Payer: Self-pay | Admitting: Nurse Practitioner

## 2018-12-30 NOTE — Telephone Encounter (Signed)
PATIENT SCHEDULED AND LETTER SENT  °

## 2019-04-15 ENCOUNTER — Ambulatory Visit: Payer: Medicare Other | Admitting: Nurse Practitioner

## 2019-07-16 ENCOUNTER — Encounter: Payer: Self-pay | Admitting: Nurse Practitioner

## 2019-07-16 ENCOUNTER — Telehealth: Payer: Self-pay | Admitting: Nurse Practitioner

## 2019-07-16 ENCOUNTER — Ambulatory Visit: Payer: Medicare Other | Admitting: Nurse Practitioner

## 2019-07-16 NOTE — Progress Notes (Deleted)
Referring Provider: Dione Housekeeper, MD Primary Care Physician:  Dione Housekeeper, MD Primary GI:  Dr.   Rayne Du chief complaint on file.   HPI:   Vincent Savage. is a 85 y.o. male who presents for hospital follow-up.  The patient was admitted to the hospital from 12/23/2018 through 12/27/2018.  On admission he complained of several days of black stools and coffee-ground emesis without abdominal pain.  He did have a previous admission for GI bleed at [facility] on 10/18/2018.  EGD during those visits with multiple gastric and duodenal bulb ulcers with visible vessel requiring epi injection and hemoclipping x3.  Evaluated by IR for embolization but unable to embolize due to aberrant anatomy.  Required 7 units of PRBCs at that time.  His GI bleed during this admission was felt due to duodenal ulceration treated with clipping on 12/24/2018 with 72-hour Protonix infusion.  If she develops a rebleed or transfusion dependent anemia would repeat EGD for epinephrine injection/cautery/additional clips.  Most recent CBC was just after hospitalization and no further CBC could be found.  It does appear the patient had a blood transfusion on 12/30/2018.  Hemoglobin on 12/26/2018 was 7.7.  Today he states   Past Medical History:  Diagnosis Date  . Anemia   . Anxiety   . Aortic valve disorder   . Arthritis   . Barrett's esophagus 2012  . CHF (congestive heart failure) ([place])   . Coronary artery disease    mild by 2013 cath  . Depression   . Diverticulosis of colon (without mention of hemorrhage)   . Dizziness   . Dysphagia   . Dyspnea    with exertion  . GERD (gastroesophageal reflux disease)   . Hiatal hernia 2012  . Hypertension   . Kidney stones   . Murmur   . Myocardial infarction [facility])    july 12,2019 one stent placed  . Pneumonia    " walking"  . Prostate cancer ([place])   . Rectal polyp   . Type II or unspecified type diabetes mellitus without mention of complication, not stated as uncontrolled      Past Surgical History:  Procedure Laterality Date  . ABDOMINAL AORTOGRAM W/LOWER EXTREMITY N/A 12/06/2017   Procedure: ABDOMINAL AORTOGRAM W/LOWER EXTREMITY;  Surgeon: Conrad [place], MD;  Location: [place] [facility];  Service: Cardiovascular;  Laterality: N/A;  . arm surgery     left  . BIOPSY  12/24/2018   Procedure: BIOPSY;  Surgeon: Danie Binder, MD;  [facility];  Service: Endoscopy;;  gastric  . CATARACT EXTRACTION    . CORONARY/GRAFT ACUTE MI REVASCULARIZATION N/A 04/13/2018   Procedure: Coronary/Graft Acute MI Revascularization;  Surgeon: [name], MD;  Location: [place] [facility];  Service: Cardiovascular;  Laterality: N/A;  . CORONARY/GRAFT ANGIOGRAPHY N/A 04/13/2018   Procedure: CORONARY/GRAFT ANGIOGRAPHY;  Surgeon: [name], MD;  Location: [place] [facility];  Service: Cardiovascular;  Laterality: N/A;  . ESOPHAGOGASTRODUODENOSCOPY (EGD) WITH PROPOFOL N/A 12/24/2018   Procedure: ESOPHAGOGASTRODUODENOSCOPY (EGD) WITH PROPOFOL;  Surgeon: Danie Binder, MD;  Location: [facility];  Service: Endoscopy;  Laterality: N/A;  . ILIAC ATHERECTOMY Right 12/20/2017   Procedure: RIGHT ANTEGRADE FAILED CANNULATION OF RIGHT SUPERFICIAL FEMORAL ARTERY.;  Surgeon: Conrad [place], MD;  Location: [place];  Service: Vascular;  Laterality: Right;  . INGUINAL HERNIA REPAIR    . LEFT HEART CATHETERIZATION WITH CORONARY ANGIOGRAM N/A 07/23/2012   Procedure: LEFT HEART CATHETERIZATION WITH CORONARY ANGIOGRAM;  Surgeon: [name], MD;  Location: [facility];  Service: Cardiovascular;  Laterality: N/A;  . prostetic prostesis     x 2  . TRANSURETHRAL RESECTION OF PROSTATE      Current Outpatient Medications  Medication Sig Dispense Refill  . acetaminophen (TYLENOL ARTHRITIS PAIN) 650 MG CR tablet Take 650 mg by mouth 2 (two) times daily.     Marland Kitchen albuterol (PROVENTIL HFA) 108 (90 Base) MCG/ACT inhaler Inhale 2 puffs into the lungs every 6 (six) hours as needed for  wheezing or shortness of breath.    Marland Kitchen atorvastatin (LIPITOR) 80 MG tablet Take 0.5 tablets (40 mg total) by mouth daily at 6 PM.    . brimonidine (ALPHAGAN) 0.2 % ophthalmic solution Place 1 drop into both eyes at bedtime.     . cyanocobalamin (,VITAMIN B-12,) 1000 MCG/ML injection Patient wishes to have b12 injection done at Dr [name] office I have advised him to call and set these up since I do not know the office policy.  Inject one ML IM once a week for three weeks, then inject one ML Im once a month for one year. Then Patient needs repeat labs done to recheck his b12 level. (Patient taking differently: Inject 1,000 mcg into the muscle every 3 (three) months. ) 1 mL 0  . escitalopram (LEXAPRO) 10 MG tablet Take 10 mg by mouth at bedtime.     . fluticasone (FLONASE) 50 MCG/ACT nasal spray Place 2 sprays into both nostrils daily as needed for allergies or rhinitis.    . furosemide (LASIX) 20 MG tablet Take 20 mg by mouth every other day.     . latanoprost (XALATAN) 0.005 % ophthalmic solution Place 1 drop into both eyes at bedtime.    . metoprolol tartrate (LOPRESSOR) 25 MG tablet Take 1 tablet (25 mg total) by mouth 2 (two) times daily. 60 tablet 2  . nitroGLYCERIN (NITROSTAT) 0.4 MG SL tablet Place 1 tablet (0.4 mg total) under the tongue every 5 (five) minutes as needed. 25 tablet 2  . ondansetron (ZOFRAN) 4 MG tablet Take 4 mg by mouth daily as needed for nausea or vomiting.     . pantoprazole (PROTONIX) 40 MG tablet Take 40 mg by mouth 2 (two) times daily.     . potassium chloride (K-DUR) 10 MEQ tablet Take 10 mEq by mouth 2 (two) times daily.      No current facility-administered medications for this visit.     Allergies as of 07/16/2019 - Review Complete 12/24/2018  Allergen Reaction Noted  . Ciprofloxacin Other (See Comments) 10/15/2014    Family History  Problem Relation Age of Onset  . Ovarian cancer Sister   . Prostate cancer Brother   . Diabetes Brother   . Heart disease  Father   . Colon cancer Neg Hx     Social History   Socioeconomic History  . Marital status: Married    Spouse name: Not on file  . Number of children: Not on file  . Years of education: Not on file  . Highest education level: Not on file  Occupational History  . Not on file  Social Needs  . Financial resource strain: Not on file  . Food insecurity    Worry: Not on file    Inability: Not on file  . Transportation needs    Medical: Not on file    Non-medical: Not on file  Tobacco Use  . Smoking status: Never Smoker  . Smokeless tobacco: Never  Used  Substance and Sexual Activity  . Alcohol use: No  . Drug use: No  . Sexual activity: Not on file  Lifestyle  . Physical activity    Days per week: Not on file    Minutes per session: Not on file  . Stress: Not on file  Relationships  . Social Herbalist on phone: Not on file    Gets together: Not on file    Attends religious service: Not on file    Active member of club or organization: Not on file    Attends meetings of clubs or organizations: Not on file    Relationship status: Not on file  Other Topics Concern  . Not on file  Social History Narrative  . Not on file    Review of Systems: General: Negative for anorexia, weight loss, fever, chills, fatigue, weakness. Eyes: Negative for vision changes.  ENT: Negative for hoarseness, difficulty swallowing , nasal congestion. CV: Negative for chest pain, angina, palpitations, dyspnea on exertion, peripheral edema.  Respiratory: Negative for dyspnea at rest, dyspnea on exertion, cough, sputum, wheezing.  GI: See history of present illness. GU:  Negative for dysuria, hematuria, urinary incontinence, urinary frequency, nocturnal urination.  MS: Negative for joint pain, low back pain.  Derm: Negative for rash or itching.  Neuro: Negative for weakness, abnormal sensation, seizure, frequent headaches, memory loss, confusion.  Psych: Negative for anxiety,  depression, suicidal ideation, hallucinations.  Endo: Negative for unusual weight change.  Heme: Negative for bruising or bleeding. Allergy: Negative for rash or hives.   Physical Exam: There were no vitals taken for this visit. General:   Alert and oriented. Pleasant and cooperative. Well-nourished and well-developed.  Head:  Normocephalic and atraumatic. Eyes:  Without icterus, sclera clear and conjunctiva pink.  Ears:  Normal auditory acuity. Mouth:  No deformity or lesions, oral mucosa pink.  Throat/Neck:  Supple, without mass or thyromegaly. Cardiovascular:  S1, S2 present without murmurs appreciated. Normal pulses noted. Extremities without clubbing or edema. Respiratory:  Clear to auscultation bilaterally. No wheezes, rales, or rhonchi. No distress.  Gastrointestinal:  +BS, soft, non-tender and non-distended. No HSM noted. No guarding or rebound. No masses appreciated.  Rectal:  Deferred  Musculoskalatal:  Symmetrical without gross deformities. Normal posture. Skin:  Intact without significant lesions or rashes. Neurologic:  Alert and oriented x4;  grossly normal neurologically. Psych:  Alert and cooperative. Normal mood and affect. Heme/Lymph/Immune: No significant cervical adenopathy. No excessive bruising noted.    07/16/2019 8:11 AM   Disclaimer: This note was dictated with voice recognition software. Similar sounding words can inadvertently be transcribed and may not be corrected upon review.

## 2019-07-16 NOTE — Telephone Encounter (Signed)
PATIENT WAS A NO SHOW AND LETTER SENT  °

## 2019-10-30 ENCOUNTER — Other Ambulatory Visit: Payer: Self-pay | Admitting: Cardiology

## 2020-04-25 DEATH — deceased
